# Patient Record
Sex: Male | Born: 1970 | Race: Black or African American | Hispanic: No | Marital: Single | Smoking: Never smoker
Health system: Southern US, Community
[De-identification: ages and names within clinical notes are randomized; demographics above are authoritative.]

## PROBLEM LIST (undated history)

## (undated) DIAGNOSIS — G4730 Sleep apnea, unspecified: Secondary | ICD-10-CM

## (undated) DIAGNOSIS — F419 Anxiety disorder, unspecified: Secondary | ICD-10-CM

## (undated) DIAGNOSIS — F329 Major depressive disorder, single episode, unspecified: Secondary | ICD-10-CM

## (undated) DIAGNOSIS — F32A Depression, unspecified: Secondary | ICD-10-CM

## (undated) DIAGNOSIS — R7303 Prediabetes: Secondary | ICD-10-CM

## (undated) HISTORY — DX: Anxiety disorder, unspecified: F41.9

## (undated) HISTORY — PX: BUNIONECTOMY: SHX129

## (undated) HISTORY — DX: Depression, unspecified: F32.A

## (undated) HISTORY — PX: KNEE ARTHROSCOPY: SUR90

## (undated) HISTORY — PX: TONSILLECTOMY: SUR1361

## (undated) HISTORY — PX: BACK SURGERY: SHX140

---
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
## 2007-04-23 ENCOUNTER — Emergency Department (HOSPITAL_COMMUNITY): Admission: EM | Admit: 2007-04-23 | Discharge: 2007-04-23 | Payer: Self-pay | Admitting: Emergency Medicine

## 2012-11-19 ENCOUNTER — Emergency Department (HOSPITAL_COMMUNITY)
Admission: EM | Admit: 2012-11-19 | Discharge: 2012-11-19 | Disposition: A | Discharge status: Home / Self Care | Payer: BC Managed Care – PPO | Source: Home / Self Care | Attending: Emergency Medicine | Admitting: Emergency Medicine

## 2012-11-19 ENCOUNTER — Encounter (HOSPITAL_COMMUNITY): Payer: Self-pay | Admitting: *Deleted

## 2012-11-19 DIAGNOSIS — M722 Plantar fascial fibromatosis: Secondary | ICD-10-CM

## 2012-11-19 MED ORDER — MELOXICAM 15 MG PO TABS: 15.0000 mg | ORAL_TABLET | Freq: Every day | ORAL | Status: DC

## 2012-11-19 MED ORDER — METHYLPREDNISOLONE ACETATE 40 MG/ML IJ SUSP
INTRAMUSCULAR | Status: AC
  Filled 2012-11-19: qty 5

## 2012-11-19 NOTE — ED Notes (Signed)
Pt  Reports     Pain     r  Foot          X   Several  Weeks            In the  Heel  Area     He  denys  Any  specefic  Injury         He  Is  Able   t ambulate on the  Affected foot  But has  Pain on  Doing  So

## 2012-11-19 NOTE — ED Provider Notes (Signed)
Chief Complaint:   Chief Complaint  Patient presents with  . Foot Pain    History of Present Illness:   Brent Crawford is a 42 year old male who has had a two-week history of plantar right heel pain. He denies any injury or swelling. He's on his feet extensively during the daytime. The pain is worse in the morning when he first gets up or if he is off his feet for a while. It also hurts if he has been on his feet for long time. The pain gets better if he stretches a little bit. He denies any numbness or tingling.  Review of Systems:  Other than noted above, the patient denies any of the following symptoms: Systemic:  No fevers, chills, or sweats.  No fatigue or tiredness. Musculoskeletal:  No joint pain, arthritis, bursitis, swelling, or back pain.  Neurological:  No muscular weakness, paresthesias.  PMFSH:  Past medical history, family history, social history, meds, and allergies were reviewed.  No history of gout.     Physical Exam:   Vital signs:  BP 142/86  Pulse 72  Temp(Src) 98.6 F (37 C) (Oral)  Resp 18  SpO2 100% Gen:  Alert and oriented times 3.  In no distress. Musculoskeletal:  Exam of the foot reveals there is pain to palpation over the insertion of the plantar fascia on the calcaneus. There was no visible swelling. There is pain at the plantar fascia stretched. He has no pain over the ankle, dorsum of the foot, instep, metatarsals, MTP joints, toes, and Achilles tendon, or lateral heel.  Otherwise, all joints had a full a ROM with no swelling, bruising or deformity.  No edema, pulses full. Extremities were warm and pink.  Capillary refill was brisk.  Skin:  Clear, warm and dry.  No rash. Neuro:  Alert and oriented times 3.  Muscle strength was normal.  Sensation was intact to light touch.   Procedure Note:  Verbal informed consent was obtained from the patient.  Risks and benefits were outlined with the patient.  Patient understands and accepts these risks.  Identity of the  patient was confirmed verbally and by armband.    Procedure was performed as follows:  The right plantar fascia was injected as follows. The medial aspect of the right foot was prepped with Betadine and alcohol, and insertion point was identified and marked, this was then anesthetized with ethyl chloride spray. He is a 1/2 inch 27-gauge needle, 1 cc of Depo-Medrol 40 mg strength and 1 cc of 2% Xylocaine were injected at the insertion of the plantar fascia. Patient tolerated procedure well. Band-Aid dressing was applied. He was instructed to rest this and apply ice for the next 24-48 hours and then begin an exercise program.  Patient tolerated the procedure well without any immediate complications.   Assessment:  The encounter diagnosis was Plantar fasciitis.  Plan:   1.  Meds:  The following meds were prescribed:   New Prescriptions   MELOXICAM (MOBIC) 15 MG TABLET    Take 1 tablet (15 mg total) by mouth daily.    2.  Patient Education/Counseling:  The patient was given appropriate handouts, self care instructions, and instructed in symptomatic relief including rest and activity, elevation, application of ice and compression.  He was instructed in proper footwear, use of a heel pad, use a night splint, and stretching exercises.  3.  Follow up:  The patient was told to follow up if no better in 3 to 4 days, if  becoming worse in any way, and given some red flag symptoms such as worsening pain which would prompt immediate return.  Follow up  With Dr. Cristie Hem if no better in 2 weeks.       Reuben Likes, MD 11/19/12 1031

## 2013-07-04 ENCOUNTER — Encounter: Payer: Self-pay | Admitting: Internal Medicine

## 2014-01-25 ENCOUNTER — Emergency Department (HOSPITAL_COMMUNITY)
Admission: EM | Admit: 2014-01-25 | Discharge: 2014-01-25 | Disposition: A | Discharge status: Home / Self Care | Payer: BC Managed Care – PPO | Attending: Emergency Medicine | Admitting: Emergency Medicine

## 2014-01-25 ENCOUNTER — Encounter (HOSPITAL_COMMUNITY): Payer: Self-pay | Admitting: *Deleted

## 2014-01-25 DIAGNOSIS — Z791 Long term (current) use of non-steroidal anti-inflammatories (NSAID): Secondary | ICD-10-CM | POA: Diagnosis not present

## 2014-01-25 DIAGNOSIS — M545 Low back pain, unspecified: Secondary | ICD-10-CM

## 2014-01-25 MED ORDER — HYDROCODONE-ACETAMINOPHEN 5-325 MG PO TABS: 2.0000 | ORAL_TABLET | ORAL | Status: DC | PRN

## 2014-01-25 MED ORDER — NAPROXEN 500 MG PO TABS: 500.0000 mg | ORAL_TABLET | Freq: Two times a day (BID) | ORAL | Status: DC

## 2014-01-25 MED ORDER — OXYCODONE-ACETAMINOPHEN 5-325 MG PO TABS
2.0000 | ORAL_TABLET | Freq: Once | ORAL | Status: AC
  Administered 2014-01-25: 2 via ORAL
  Filled 2014-01-25: qty 2

## 2014-01-25 NOTE — ED Notes (Signed)
Pt is here with left lower back pain since 1200 yesterday after bending over to pick something up and now increase pain to lower back if puts weight on left side

## 2014-01-25 NOTE — ED Provider Notes (Signed)
CSN: [id number]     Arrival date & time 01/25/14  [id number] History   First MD Initiated Contact with Patient 01/25/14 [phone]     Chief Complaint  Patient presents with  . Back Pain     (Consider location/radiation/quality/duration/timing/severity/associated sxs/prior Treatment) HPI Brent Crawford is a 43 y.o. male with no significant past medical history comes in for evaluation of low back pain. Patient states yesterday at noon he was bending over to pick up a very light box and "turned the wrong way" and immediately felt a sharp pain in his lower left back that radiates down his left leg. He reports trying one of his girlfriend's Flexeril tablets with minimal relief. He has not tried any other medications. Certain movements exacerbate the pain. Patient is ambulatory with discomfort. No other modifying factors He denies numbness, weakness, saddle paresthesias. No other pathological back pain red flags.   History reviewed. No pertinent past medical history. Past Surgical History  Procedure Laterality Date  . Knee arthroscopy    . Bunionectomy     No family history on file. History  Substance Use Topics  . Smoking status: Never Smoker   . Smokeless tobacco: Current User  . Alcohol Use: Yes    Review of Systems  Constitutional: Negative for fever.  HENT: Negative for sore throat.   Eyes: Negative for visual disturbance.  Respiratory: Negative for shortness of breath.   Cardiovascular: Negative for chest pain.  Gastrointestinal: Negative for abdominal pain.  Endocrine: Negative for polyuria.  Genitourinary: Negative for dysuria.  Musculoskeletal: Positive for back pain.  Skin: Negative for rash.  Neurological: Negative for headaches.      Allergies  Review of patient's allergies indicates no known allergies.  Home Medications   Prior to Admission medications   Medication Sig Start Date End Date Taking? Authorizing Provider  HYDROcodone-acetaminophen (NORCO/VICODIN) 5-325 MG per  tablet Take 2 tablets by mouth every 4 (four) hours as needed for moderate pain or severe pain. 01/25/14   Earle GellBenjamin W Ronniesha Seibold, PA-C  meloxicam (MOBIC) 15 MG tablet Take 1 tablet (15 mg total) by mouth daily. 11/19/12   Reuben Likesavid C Keller, MD  naproxen (NAPROSYN) 500 MG tablet Take 1 tablet (500 mg total) by mouth 2 (two) times daily. 01/25/14   Earle GellBenjamin W Dairon Procter, PA-C   BP 144/77 mmHg  Pulse 88  Temp(Src) 98.4 F (36.9 C) (Oral)  Resp 20  SpO2 97% Physical Exam  Constitutional:  Awake, alert, nontoxic appearance with baseline speech.  HENT:  Head: Atraumatic.  Eyes: Pupils are equal, round, and reactive to light. Right eye exhibits no discharge. Left eye exhibits no discharge.  Neck: Neck supple.  Cardiovascular: Normal rate and regular rhythm.   No murmur heard. Pulmonary/Chest: Effort normal and breath sounds normal. No respiratory distress. He has no wheezes. He has no rales. He exhibits no tenderness.  Abdominal: Soft. Bowel sounds are normal. He exhibits no mass. There is no tenderness. There is no rebound.  Musculoskeletal:       Thoracic back: He exhibits no tenderness.       Lumbar back: He exhibits no tenderness.  Tenderness to palpation in left paraspinal lumbar muscles and over SI joint. No obvious rashes, lesions or deformities appreciated  Bilateral lower extremities non tender without new rashes or color change, baseline ROM with intact DP / PT pulses, CR<2 secs all digits bilaterally, sensation baseline light touch bilaterally for pt, DTR's symmetric and intact bilaterally KJ / AJ, motor symmetric bilateral 5 / 5  hip flexion, quadriceps, hamstrings, EHL, foot dorsiflexion, foot plantarflexion, gait somewhat antalgic but without apparent new ataxia.  Neurological:  Mental status baseline for patient.  Upper extremity motor strength and sensation intact and symmetric bilaterally.  Skin: No rash noted.  Psychiatric: He has a normal mood and affect.  Nursing note and vitals  reviewed.   ED Course  Procedures (including critical care time) Labs Review Labs Reviewed - No data to display  Imaging Review No results found.   EKG Interpretation None     Meds given in ED:  Medications  oxyCODONE-acetaminophen (PERCOCET/ROXICET) 5-325 MG per tablet 2 tablet (not administered)    New Prescriptions   HYDROCODONE-ACETAMINOPHEN (NORCO/VICODIN) 5-325 MG PER TABLET    Take 2 tablets by mouth every 4 (four) hours as needed for moderate pain or severe pain.   NAPROXEN (NAPROSYN) 500 MG TABLET    Take 1 tablet (500 mg total) by mouth 2 (two) times daily.   Filed Vitals:   01/25/14 0542  BP: 144/77  Pulse: 88  Temp: 98.4 F (36.9 C)  TempSrc: Oral  Resp: 20  SpO2: 97%    MDM  Vitals stable - WNL -afebrile Pt resting comfortably in ED. pain treated in ED. Patient states he feels better PE--not concerning for other acute or emergent pathologies. No pathologic back pain red flags. Doubt cauda equina, conus medullaris syndrome, epidural abscess  Will DC with naproxen for pain and inflammation management with Vicodin for breakthrough pain Discussed f/u with PCP and return precautions, pt very amenable to plan. Patient stable, in good condition and is appropriate for discharge   Final diagnoses:  Left-sided low back pain without sciatica        Sharlene MottsBenjamin W Breeann Reposa, PA-C 01/25/14 [id number]  Arby BarretteMarcy Pfeiffer, MD 01/25/14 1650

## 2014-01-26 ENCOUNTER — Other Ambulatory Visit: Payer: Self-pay | Admitting: Chiropractic Medicine

## 2014-01-26 ENCOUNTER — Ambulatory Visit
Admission: RE | Admit: 2014-01-26 | Discharge: 2014-01-26 | Disposition: A | Discharge status: Home / Self Care | Payer: BC Managed Care – PPO | Source: Ambulatory Visit | Attending: Chiropractic Medicine | Admitting: Chiropractic Medicine

## 2014-01-26 DIAGNOSIS — M545 Low back pain: Secondary | ICD-10-CM

## 2014-02-02 ENCOUNTER — Other Ambulatory Visit: Payer: Self-pay | Admitting: Chiropractic Medicine

## 2014-02-02 DIAGNOSIS — M5116 Intervertebral disc disorders with radiculopathy, lumbar region: Secondary | ICD-10-CM

## 2014-02-11 ENCOUNTER — Ambulatory Visit
Admission: RE | Admit: 2014-02-11 | Discharge: 2014-02-11 | Disposition: A | Discharge status: Home / Self Care | Payer: BC Managed Care – PPO | Source: Ambulatory Visit | Attending: Chiropractic Medicine | Admitting: Chiropractic Medicine

## 2014-02-11 DIAGNOSIS — M5116 Intervertebral disc disorders with radiculopathy, lumbar region: Secondary | ICD-10-CM

## 2014-10-20 ENCOUNTER — Other Ambulatory Visit: Payer: Self-pay | Admitting: Neurosurgery

## 2014-10-20 DIAGNOSIS — M5416 Radiculopathy, lumbar region: Secondary | ICD-10-CM

## 2014-10-31 ENCOUNTER — Ambulatory Visit
Admission: RE | Admit: 2014-10-31 | Discharge: 2014-10-31 | Disposition: A | Discharge status: Home / Self Care | Payer: Self-pay | Source: Ambulatory Visit | Attending: Neurosurgery | Admitting: Neurosurgery

## 2014-10-31 ENCOUNTER — Other Ambulatory Visit: Payer: Self-pay | Admitting: Neurosurgery

## 2014-10-31 DIAGNOSIS — M5416 Radiculopathy, lumbar region: Secondary | ICD-10-CM

## 2015-08-06 ENCOUNTER — Other Ambulatory Visit: Payer: Self-pay | Admitting: Orthopedic Surgery

## 2015-08-06 DIAGNOSIS — M5416 Radiculopathy, lumbar region: Secondary | ICD-10-CM

## 2015-08-14 ENCOUNTER — Inpatient Hospital Stay: Admission: RE | Admit: 2015-08-14 | Payer: Self-pay | Source: Ambulatory Visit

## 2016-11-13 ENCOUNTER — Emergency Department (HOSPITAL_COMMUNITY): Payer: BLUE CROSS/BLUE SHIELD

## 2016-11-13 ENCOUNTER — Encounter (HOSPITAL_COMMUNITY): Payer: Self-pay | Admitting: *Deleted

## 2016-11-13 ENCOUNTER — Other Ambulatory Visit: Payer: Self-pay

## 2016-11-13 ENCOUNTER — Emergency Department (HOSPITAL_COMMUNITY)
Admission: EM | Admit: 2016-11-13 | Discharge: 2016-11-13 | Disposition: A | Discharge status: Home / Self Care | Payer: BLUE CROSS/BLUE SHIELD | Attending: Emergency Medicine | Admitting: Emergency Medicine

## 2016-11-13 DIAGNOSIS — R079 Chest pain, unspecified: Secondary | ICD-10-CM

## 2016-11-13 LAB — CBC
HCT: 37.4 % — ABNORMAL LOW (ref 39.0–52.0)
Hemoglobin: 11.5 g/dL — ABNORMAL LOW (ref 13.0–17.0)
MCH: 24.4 pg — ABNORMAL LOW (ref 26.0–34.0)
MCHC: 30.7 g/dL (ref 30.0–36.0)
MCV: 79.4 fL (ref 78.0–100.0)
PLATELETS: 189 10*3/uL (ref 150–400)
RBC: 4.71 MIL/uL (ref 4.22–5.81)
RDW: 14 % (ref 11.5–15.5)
WBC: 8.4 10*3/uL (ref 4.0–10.5)

## 2016-11-13 LAB — I-STAT TROPONIN, ED: TROPONIN I, POC: 0 ng/mL (ref 0.00–0.08)

## 2016-11-13 LAB — BASIC METABOLIC PANEL
Anion gap: 4 — ABNORMAL LOW (ref 5–15)
BUN: 9 mg/dL (ref 6–20)
CALCIUM: 8.8 mg/dL — AB (ref 8.9–10.3)
CO2: 28 mmol/L (ref 22–32)
CREATININE: 1.24 mg/dL (ref 0.61–1.24)
Chloride: 105 mmol/L (ref 101–111)
GFR calc Af Amer: >60 mL/min (ref >60)
GLUCOSE: 89 mg/dL (ref 65–99)
Potassium: 4.4 mmol/L (ref 3.5–5.1)
SODIUM: 137 mmol/L (ref 135–145)

## 2016-11-13 LAB — TROPONIN I: Troponin I: <0.03 ng/mL (ref <0.03)

## 2016-11-13 MED ORDER — KETOROLAC TROMETHAMINE 60 MG/2ML IM SOLN
60.0000 mg | Freq: Once | INTRAMUSCULAR | Status: AC
  Administered 2016-11-13: 60 mg via INTRAMUSCULAR
  Filled 2016-11-13: qty 2

## 2016-11-13 MED ORDER — GABAPENTIN 100 MG PO CAPS: 100.0000 mg | ORAL_CAPSULE | Freq: Three times a day (TID) | ORAL | 0 refills | Status: DC

## 2016-11-13 MED ORDER — IBUPROFEN 800 MG PO TABS: 800.0000 mg | ORAL_TABLET | Freq: Three times a day (TID) | ORAL | 0 refills | Status: DC

## 2016-11-13 NOTE — ED Triage Notes (Signed)
Pt is here with left sided chest pain and the soreness is 6/10.  Right foot tingly times one week.

## 2016-11-13 NOTE — ED Notes (Signed)
ED Provider at bedside. 

## 2016-11-13 NOTE — ED Provider Notes (Signed)
Emergency Department Provider Note   I have reviewed the triage vital signs and the nursing notes.   HISTORY  Chief Complaint Chest Pain   HPI Brent Crawford is a 46 y.o. male without a significant past medical history the presents to the emergency department with left-sided chest pain just below his ribs is worse with a deep breath but no other exacerbating factors. No trauma to the area no obvious heavy lifting but he does work at [employer] as a Curator. States that this started last night and anything for symptoms but did take his normal 2 oxycodone before bed. Says he had a couple episodes of pain and overnight and then persisted again this morning. No association was breath, wheezing, cough, fever, lightheadedness, syncope, diaphoresis or other associated symptoms. Has had some of this before with a pulled muscle. Patient also complains of right foot paresthesias. Patient states this feels similar to previous episodes of left foot paresthesias when he had a back surgery. No trauma. No urinary retention, bowel incontinence, saddle anesthesia or weakness in that leg. It comes and goes. No obvious exacerbating or relieving factors. Not related to position.  History reviewed. No pertinent past medical history.  There are no active problems to display for this patient.   Past Surgical History:  Procedure Laterality Date  . BACK SURGERY    . BUNIONECTOMY    . KNEE ARTHROSCOPY      Current Outpatient Rx  . Order #: [id number] Class: Print  . Order #: [id number] Class: Print  . Order #: [id number] Class: Print  . Order #: [id number] Class: Normal  . Order #: [id number] Class: Print    Allergies Patient has no known allergies.  No family history on file.  Social History Social History  Substance Use Topics  . Smoking status: Never Smoker  . Smokeless tobacco: Current User  . Alcohol use Yes    Review of Systems  All other systems negative except as documented in the HPI. All pertinent  positives and negatives as reviewed in the HPI. ____________________________________________   PHYSICAL EXAM:  VITAL SIGNS: ED Triage Vitals  Enc Vitals Group     BP 11/13/16 1535 (!) 141/89     Pulse Rate 11/13/16 1535 80     Resp 11/13/16 1535 16     Temp 11/13/16 1535 98.1 F (36.7 C)     Temp Source 11/13/16 1535 Oral     SpO2 11/13/16 1535 97 %     Weight 11/13/16 1535 260 lb (117.9 kg)     Height 11/13/16 1535  (1.753 m)     Head Circumference --      Peak Flow --      Pain Score 11/13/16 1539 6     Pain Loc --      Pain Edu? --      Excl. in GC? --     Constitutional: Alert and oriented. Well appearing and in no acute distress. Eyes: Conjunctivae are normal. PERRL. EOMI. Head: Atraumatic. Nose: No congestion/rhinnorhea. Mouth/Throat: Mucous membranes are moist.  Oropharynx non-erythematous. Neck: No stridor.  No meningeal signs.   Cardiovascular: Normal rate, regular rhythm. Good peripheral circulation. Grossly normal heart sounds.   Respiratory: Normal respiratory effort.  No retractions. Lungs CTAB. Gastrointestinal: Soft and nontender. No distention. No abdominal ttp or suprapubic fullness/tenderness.  Musculoskeletal: No lower extremity tenderness nor edema. No gross deformities of extremities. Neurologic:  Normal speech and language. No gross focal neurologic deficits are appreciated. Normal RLE strength, sensation and patellar reflexes. No  saddle anesthesia.  Skin:  Skin is warm, dry and intact. No rash noted.   ____________________________________________   LABS (all labs ordered are listed, but only abnormal results are displayed)  Labs Reviewed  BASIC METABOLIC PANEL - Abnormal; Notable for the following:       Result Value   Calcium 8.8 (*)    Anion gap 4 (*)    All other components within normal limits  CBC - Abnormal; Notable for the following:    Hemoglobin 11.5 (*)    HCT 37.4 (*)    MCH 24.4 (*)    All other components within normal  limits  TROPONIN I  I-STAT TROPONIN, ED   ____________________________________________  EKG   EKG Interpretation  Date/Time:  Monday November 13 2016 15:28:42 EDT Ventricular Rate:  78 PR Interval:  158 QRS Duration: 68 QT Interval:  344 QTC Calculation: 392 R Axis:   68 Text Interpretation:  Normal sinus rhythm Cannot rule out Anterior infarct , age undetermined Abnormal ECG Baseline wander No significant change since last tracing Confirmed by Marily Memos [id number]) on 11/13/2016 6:33:37 PM       EKG Interpretation  Date/Time:  Monday November 13 2016 19:20:06 EDT Ventricular Rate:  55 PR Interval:  158 QRS Duration: 131 QT Interval:  404 QTC Calculation: 387 R Axis:   25 Text Interpretation:  Sinus rhythm Nonspecific intraventricular conduction delay No change from earlier today or since 03/2007 Confirmed by Marily Memos [phone]) on 11/13/2016 7:54:15 PM        ____________________________________________  RADIOLOGY  Dg Chest 2 View  Result Date: 11/13/2016 CLINICAL DATA:  Left chest pain and soreness today. Initial encounter. EXAM: CHEST  2 VIEW COMPARISON:  None. FINDINGS: The lungs are clear. Heart size is normal. No pneumothorax or pleural fluid. No focal bony abnormality. IMPRESSION: No acute disease. Electronically Signed   By: Drusilla Kanner M.D.   On: 11/13/2016 15:58    ____________________________________________   PROCEDURES  Procedure(s) performed:   Procedures   ____________________________________________   INITIAL IMPRESSION / ASSESSMENT AND PLAN / ED COURSE  Pertinent labs & imaging results that were available during my care of the patient were reviewed by me and considered in my medical decision making (see chart for details).  Unlikely to be ischemic in nature. Patient likely with muscular cause for symptoms. Improved with Toradol. Also with right lower extremity paresthesias could be related to his back low suspicion for cauda equina  or any other emergent causes for her symptoms this time. Low risk for PE based on wells/PERC. No e/o pneumonia/pericarditis/myocardiits or other acute causes on workup today.   ____________________________________________  FINAL CLINICAL IMPRESSION(S) / ED DIAGNOSES  Final diagnoses:  Nonspecific chest pain     MEDICATIONS GIVEN DURING THIS VISIT:  Medications  ketorolac (TORADOL) injection 60 mg (60 mg Intramuscular Given 11/13/16 1912)     NEW OUTPATIENT MEDICATIONS STARTED DURING THIS VISIT:  Discharge Medication List as of 11/13/2016  8:51 PM    START taking these medications   Details  gabapentin (NEURONTIN) 100 MG capsule Take 1 capsule (100 mg total) by mouth 3 (three) times daily., Starting Mon 11/13/2016, Print    ibuprofen (ADVIL,MOTRIN) 800 MG tablet Take 1 tablet (800 mg total) by mouth 3 (three) times daily., Starting Mon 11/13/2016, Print        Note:  This document was prepared using Dragon voice recognition software and may include unintentional dictation errors.   Marily Memos, MD 11/13/16 2141

## 2017-09-07 ENCOUNTER — Encounter: Payer: BLUE CROSS/BLUE SHIELD | Admitting: Podiatry

## 2017-11-05 NOTE — Progress Notes (Signed)
This encounter was created in error - please disregard.

## 2019-04-14 ENCOUNTER — Other Ambulatory Visit: Payer: Self-pay

## 2019-04-14 ENCOUNTER — Ambulatory Visit: Payer: No Typology Code available for payment source | Attending: Neurosurgery | Admitting: Physical Therapy

## 2019-04-14 ENCOUNTER — Encounter: Payer: Self-pay | Admitting: Physical Therapy

## 2019-04-14 DIAGNOSIS — M5416 Radiculopathy, lumbar region: Secondary | ICD-10-CM | POA: Insufficient documentation

## 2019-04-14 NOTE — Therapy (Signed)
[facility] [address] [place], [place], [address] Phone: [phone]   Fax:  [phone]  Physical Therapy Evaluation  Patient Details  Name: Brent Crawford MRN: [id number] Date of Birth: 1970-07-27 Referring Provider (PT): Hoyt Koch, MD   Encounter Date: 04/14/2019  PT End of Session - 04/14/19 1102    Visit Number  1    Number of Visits  15    Date for PT Re-Evaluation  05/26/19    Authorization Type  [facility]-15 visits authorized exp 09/24/19    Authorization - Visit Number  1    Authorization - Number of Visits  15    PT Start Time  0851    PT Stop Time  0925    PT Time Calculation (min)  34 min    Activity Tolerance  Patient tolerated treatment well    Behavior During Therapy  [facility] for tasks assessed/performed       Past Medical History:  Diagnosis Date  . Anxiety   . Depression     Past Surgical History:  Procedure Laterality Date  . BACK SURGERY    . BUNIONECTOMY    . KNEE ARTHROSCOPY      There were no vitals filed for this visit.   Subjective Assessment - 04/14/19 1045    Subjective  Pt. is a 49 y/o male referred to PT for lumbar radiculopathy diagnosis with history of L4-5 microdiskectomy for left-sided radicular symptoms in 2016. He has developed new right-sided leg pain symptoms with insidious onset over the past 6 months. Pain is increased in particular with standing and walking and eases with trunk ROM into flexion. He had most recent MRI 8/20 whiched showed severe left L4-5 foraminal stenosis and grade I spondylolisthesis but no evidence of neural impingement on right.    Pertinent History  L4-5 microdiskectomy 2016    Limitations  Standing;Walking    Diagnostic tests  MRI    Patient Stated Goals  Get rid of back and leg pain    Currently in Pain?  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 PT Assessment - 04/14/19 0001      Assessment   Medical Diagnosis  Lumbar radiculopathy    Referring Provider (PT)  Hoyt Koch, MD    Onset Date/Surgical Date  --   chronic history with exacerbation x 6 months   Prior Therapy  past PT for back around 2016-2017      Precautions   Precautions  None      Restrictions   Weight Bearing Restrictions  No      Balance Screen   Has the patient fallen in the past 6 months  No      Home Environment   Living Environment  Private residence    Living Arrangements  Spouse/significant other;Children    Type of [place] Access  Level entry    [place]  One level      Prior Function   Level of Independence  Independent    Vocation  --   Engineer, 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     Cognition   Overall Cognitive Status  Within Functional Limits for tasks assessed      Observation/Other Assessments   Focus on Therapeutic Outcomes (FOTO)   unable to assess due to clinic internet out at time of eval      Sensation   Light Touch  Appears Intact      Posture/Postural Control   Posture Comments  Mild rounding of shoulders and lumbar spine in sitting      ROM / Strength   AROM / PROM / Strength  AROM;Strength      AROM   Overall AROM Comments  Increased back pain with extension and report of increased right leg pain with left sidebending ROM    AROM Assessment Site  Hip;Lumbar    Right/Left Hip  Right;Left    Right Hip Flexion  90    Right Hip External  Rotation   35    Right Hip Internal Rotation   15    Right Hip ABduction  45    Right Hip ADduction  --   [facility]   Left Hip Flexion  100    Left Hip External Rotation   30    Left Hip Internal Rotation   15    Left Hip ABduction  45    Left Hip ADduction  --   [facility]   Lumbar Flexion  80    Lumbar Extension  10    Lumbar - Right Side Bend  35    Lumbar - Left Side Bend  25    Lumbar - Right Rotation  30%    Lumbar - Left Rotation  50%      Strength   Strength Assessment Site  Hip;Knee;Ankle    Right/Left Hip  Right;Left    Right Hip Flexion  4-/5    Right Hip Extension  4/5    Right Hip External Rotation   4/5    Right Hip Internal Rotation  4/5    Right Hip ABduction  4/5    Right Hip ADduction  4/5    Left Hip Flexion  4/5    Left Hip Extension  4/5    Left Hip External Rotation  5/5    Left Hip Internal Rotation  5/5    Left Hip ABduction  4/5    Left Hip ADduction  4/5    Right/Left Knee  Right;Left    Right Knee Flexion  5/5    Right Knee Extension  5/5    Left Knee Flexion  5/5    Left Knee Extension  5/5    Right/Left Ankle  Right;Left    Right Ankle Dorsiflexion  5/5    Right Ankle Inversion  5/5    Right Ankle Eversion  5/5    Left Ankle Dorsiflexion  5/5    Left Ankle Inversion  5/5    Left Ankle Eversion  5/5      Flexibility   Soft Tissue Assessment /Muscle Length  --   hamstring tightness SLR 70 deg bilat., piriformis tightness      Palpation   Palpation comment  Sore with palpation right lateral lumbar paraspinal and QL region, no significant TTP right posterolateral hip      Special Tests   Other special tests  SLR (-) bilat.                Objective measurements completed on examination: See above findings.      [facility] Adult PT Treatment/Exercise - 04/14/19 0001      Exercises   Exercises  Lumbar      Lumbar Exercises: Supine   Other Supine Lumbar Exercises  HEP instruction pelvic tilt, SKTC vs. seated trunk flexion, LTR, standing  rigth QL stretch in doorway-unable to print handout due to internet down at time of eval             PT Education - 04/14/19 1101    Education Details  HEP, spinal anatomy, potential symptom etiology, POC    Person(s) Educated  Patient    Methods  Explanation;Demonstration;Tactile cues;Verbal cues    Comprehension  Returned demonstration;Verbalized understanding          PT Long Term Goals - 04/14/19 1112      PT LONG TERM GOAL #1   Title  Independent with HEP    Baseline  needs HEP    Time  6    Period  Weeks    Status  New    Target Date  05/26/19      PT LONG TERM GOAL #2   Title  Tolerate standing/walking periods at least 30-40 minutes for IADLs and community mobility for activities such as shopping with lumbar and LE pain decreased at least 50% from baseline    Baseline  3/10 pain this AM but higher pain with standing and walking, worse later in day    Time  6    Period  Weeks    Status  New    Target Date  05/26/19      PT LONG TERM GOAL #3   Title  Increase bilat. 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 presents with lumbar and right>left LE radicular symptoms with surgical history 2016 and MRI findings of severe left-sided neuroforaminal stenosis. He presents with flexion bias for trunk ROM which along with symptom exacaerbation with standing and walking would be consistent  with underlying stenosis. Additional findings of LE weakness and muscle tightness-for right-sided symptoms suspect potential muscular involvement with QL. Pt. would benefit from trial PT to attempt conservative management of symptoms.    Personal Factors and Comorbidities  Comorbidity 2;Time since  onset of injury/illness/exacerbation    Comorbidities  surgical history, anxiety/depression    Examination-Activity Limitations  Locomotion Level;Stand    Examination-Participation Restrictions  Community Activity    Stability/Clinical Decision Making  Evolving/Moderate complexity    Clinical Decision Making  Moderate    Rehab Potential  Fair    PT Frequency  2x / week    PT Duration  6 weeks    PT Treatment/Interventions  ADLs/Self Care Home Management;Cryotherapy;Traction;Ultrasound;Electrical Stimulation;Moist Heat;Therapeutic activities;Therapeutic exercise;Patient/family education;Manual techniques;Dry needling;Neuromuscular re-education    PT Next Visit Plan  check FOTO (unable at eval due to internet down) and review meds, review HEP and print copy, caution-no dry needling to left lumbar multifidi due to surgical history, continue/progress flexion bias trunk ROM and stabiization, stretch and possible trial dry needling to right QL, stretch hamstrings and piriformis    PT Home Exercise Plan  pelvic tilt, SKTC vs. seated trunk flexion, LTR, standing right QL stretch in doorway    Consulted and Agree with Plan of Care  Patient       Patient will benefit from skilled therapeutic intervention in order to improve the following deficits and impairments:  Difficulty walking, Decreased activity tolerance, Decreased strength, Impaired flexibility, Pain, Decreased range of motion  Visit Diagnosis: Radiculopathy, lumbar region     Problem List There are no problems to display for this patient.   Lazarus Gowda, PT, DPT 04/14/19 11:20 AM  [facility] [address] [place], [place], [address] Phone: [phone]   Fax:  [phone]  Name: Harvey Matlack MRN: [id number] Date of Birth: 1970-08-27

## 2019-11-21 ENCOUNTER — Other Ambulatory Visit: Payer: Self-pay | Admitting: Neurosurgery

## 2019-12-22 NOTE — Progress Notes (Signed)
[id number] [place], [place] - [address] AT [facility] [address] [place] [address] Phone: [phone] Fax: [phone]      Your procedure is scheduled on Thursday, October 28th.  Report to [facility] Main Entrance "A" at 5:30 A.M., and check in at the Admitting office.  Call this number if you have problems the morning of surgery:  [phone]  Call [phone] if you have any questions prior to your surgery date Monday-Friday 8am-4pm    Remember:  Do not eat or drink after midnight the night before your surgery   Take these medicines the morning of surgery with A SIP OF WATER   Oxycodone - if needed  As of today, STOP taking any Aspirin (unless otherwise instructed by your surgeon) Aleve, Naproxen, Ibuprofen, Motrin, Advil, Goody's, BC's, all herbal medications, fish oil, and all vitamins.                      Do not wear jewelry            Do not wear lotions, powders, colognes, or deodorant.            Men may shave face and neck.            Do not bring valuables to the hospital.            [facility] is not responsible for any belongings or valuables.  Do NOT Smoke (Tobacco/Vaping) or drink Alcohol 24 hours prior to your procedure If you use a CPAP at night, you may bring all equipment for your overnight stay.   Contacts, glasses, dentures or bridgework may not be worn into surgery.      For patients admitted to the hospital, discharge time will be determined by your treatment team.   Patients discharged the day of surgery will not be allowed to drive home, and someone needs to stay with them for 24 hours.    Special instructions:   [facility]- Preparing For Surgery  Before surgery, you can play an important role. Because skin is not sterile, your skin needs to be as free of germs as possible. You can reduce the number of germs on your skin by washing with CHG (chlorahexidine gluconate) Soap before  surgery.  CHG is an antiseptic cleaner which kills germs and bonds with the skin to continue killing germs even after washing.    Oral Hygiene is also important to reduce your risk of infection.  Remember - BRUSH YOUR TEETH THE MORNING OF SURGERY WITH YOUR REGULAR TOOTHPASTE  Please do not use if you have an allergy to CHG or antibacterial soaps. If your skin becomes reddened/irritated stop using the CHG.  Do not shave (including legs and underarms) for at least 48 hours prior to first CHG shower. It is OK to shave your face.  Please follow these instructions carefully.   1. Shower the NIGHT BEFORE SURGERY and the MORNING OF SURGERY with CHG Soap.   2. If you chose to wash your hair, wash your hair first as usual with your normal shampoo.  3. After you shampoo, rinse your hair and body thoroughly to remove the shampoo.  4. Use CHG as you would any other liquid soap. You can apply CHG directly to the skin and wash gently with a scrungie or a clean washcloth.   5. Apply the CHG Soap to your body ONLY FROM THE NECK DOWN.  Do not  use on open wounds or open sores. Avoid contact with your eyes, ears, mouth and genitals (private parts). Wash Face and genitals (private parts)  with your normal soap.   6. Wash thoroughly, paying special attention to the area where your surgery will be performed.  7. Thoroughly rinse your body with warm water from the neck down.  8. DO NOT shower/wash with your normal soap after using and rinsing off the CHG Soap.  9. Pat yourself dry with a CLEAN TOWEL.  10. Wear CLEAN PAJAMAS to bed the night before surgery  11. Place CLEAN SHEETS on your bed the night of your first shower and DO NOT SLEEP WITH PETS.   Day of Surgery: Wear Clean/Comfortable clothing the morning of surgery Do not apply any deodorants/lotions.   Remember to brush your teeth WITH YOUR REGULAR TOOTHPASTE.   Please read over the following fact sheets that you were given.

## 2019-12-23 ENCOUNTER — Inpatient Hospital Stay (HOSPITAL_COMMUNITY)
Admission: RE | Admit: 2019-12-23 | Discharge: 2019-12-23 | Disposition: A | Discharge status: Home / Self Care | Payer: Non-veteran care | Source: Ambulatory Visit

## 2019-12-23 ENCOUNTER — Other Ambulatory Visit (HOSPITAL_COMMUNITY)
Admission: RE | Admit: 2019-12-23 | Discharge: 2019-12-23 | Disposition: A | Discharge status: Home / Self Care | Payer: No Typology Code available for payment source | Source: Ambulatory Visit | Attending: Neurosurgery | Admitting: Neurosurgery

## 2019-12-23 DIAGNOSIS — Z01812 Encounter for preprocedural laboratory examination: Secondary | ICD-10-CM | POA: Insufficient documentation

## 2019-12-23 DIAGNOSIS — Z20822 Contact with and (suspected) exposure to covid-19: Secondary | ICD-10-CM | POA: Insufficient documentation

## 2019-12-23 LAB — SARS CORONAVIRUS 2 (TAT 6-24 HRS): SARS Coronavirus 2: NEGATIVE

## 2019-12-24 ENCOUNTER — Other Ambulatory Visit: Payer: Self-pay

## 2019-12-24 ENCOUNTER — Encounter (HOSPITAL_COMMUNITY): Payer: Self-pay

## 2019-12-24 ENCOUNTER — Encounter (HOSPITAL_COMMUNITY)
Admission: RE | Admit: 2019-12-24 | Discharge: 2019-12-24 | Disposition: A | Discharge status: Home / Self Care | Payer: No Typology Code available for payment source | Source: Ambulatory Visit | Attending: Neurosurgery | Admitting: Neurosurgery

## 2019-12-24 DIAGNOSIS — Z79891 Long term (current) use of opiate analgesic: Secondary | ICD-10-CM | POA: Insufficient documentation

## 2019-12-24 DIAGNOSIS — Z6841 Body Mass Index (BMI) 40.0 and over, adult: Secondary | ICD-10-CM | POA: Insufficient documentation

## 2019-12-24 DIAGNOSIS — Z01812 Encounter for preprocedural laboratory examination: Secondary | ICD-10-CM | POA: Insufficient documentation

## 2019-12-24 DIAGNOSIS — M4316 Spondylolisthesis, lumbar region: Secondary | ICD-10-CM | POA: Insufficient documentation

## 2019-12-24 DIAGNOSIS — R7303 Prediabetes: Secondary | ICD-10-CM | POA: Insufficient documentation

## 2019-12-24 DIAGNOSIS — G4733 Obstructive sleep apnea (adult) (pediatric): Secondary | ICD-10-CM | POA: Insufficient documentation

## 2019-12-24 HISTORY — DX: Prediabetes: R73.03

## 2019-12-24 HISTORY — DX: Sleep apnea, unspecified: G47.30

## 2019-12-24 LAB — CBC
HCT: 41.7 % (ref 39.0–52.0)
Hemoglobin: 12.4 g/dL — ABNORMAL LOW (ref 13.0–17.0)
MCH: 24.4 pg — ABNORMAL LOW (ref 26.0–34.0)
MCHC: 29.7 g/dL — ABNORMAL LOW (ref 30.0–36.0)
MCV: 82.1 fL (ref 80.0–100.0)
Platelets: 246 10*3/uL (ref 150–400)
RBC: 5.08 MIL/uL (ref 4.22–5.81)
RDW: 13.4 % (ref 11.5–15.5)
WBC: 6 10*3/uL (ref 4.0–10.5)
nRBC: 0 % (ref 0.0–0.2)

## 2019-12-24 LAB — GLUCOSE, CAPILLARY: Glucose-Capillary: 85 mg/dL (ref 70–99)

## 2019-12-24 LAB — TYPE AND SCREEN
ABO/RH(D): O POS
Antibody Screen: NEGATIVE

## 2019-12-24 LAB — SURGICAL PCR SCREEN
MRSA, PCR: NEGATIVE
Staphylococcus aureus: NEGATIVE

## 2019-12-24 MED ORDER — DEXTROSE 5 % IV SOLN
3.0000 g | INTRAVENOUS | Status: AC
  Administered 2019-12-25: 3 g via INTRAVENOUS
  Filled 2019-12-24: qty 3000
  Filled 2019-12-24: qty 3

## 2019-12-24 NOTE — Progress Notes (Signed)
Anesthesia Chart Review:  Case: [id number] Date/Time: 12/25/19 0715   Procedure: TLIF L45,RIGHT L5-S1 FORAMINOTOMY (Right ) - 3C   Anesthesia type: General   Pre-op diagnosis: SPONDYLOLISTHESIS AT LUMBAR 4- LUMBAR 5 LEVEL   Location: [facility] OR ROOM 20 / [place] OR   Surgeons: Vallarie Mare, MD      DISCUSSION: Patient is a 48 year old male scheduled for the above procedure.   History includes never smoker, OSA (does not use CPAP), pre-diabetes, anxiety, depression, back surgery (~ 2016). BMI is consistent with morbid obesity.   Reported pre-DM history. Random CBG 85. A1c done with PAT labs, but is in process. [facility] primary care records requested with 11/14/19 labs summarized below which included an A1c of 6.0% and unremarkable CMET.  He denied chest pain, SOB, cough, fever at PAT RN visit.   12/23/19 presurgical COVID-19 test negative. A1c result is still in process, but only 6.0% in June and CBG 85 at PAT. Anesthesia team to evaluate on the day of surgery.   VS: BP (!) 141/93   Pulse 70   Temp 36.8 C (Oral)   Resp 18   Ht 5' 9"  (1.753 m)   Wt 130.2 kg   SpO2 100%   BMI 42.38 kg/m   PROVIDERS: Annetta Maw, MD is PCP [facility])   LABS: Labs reviewed: Acceptable for surgery. Labs from the [facility] from 11/14/19 showed Na 136, K 4.0, BUN 9, Cr 1.010, glucose 90, ALT 21, AST 11, Alk Ph 72, albumin 3.4, total bili 0.2, A1c 6.0%. (all labs ordered are listed, but only abnormal results are displayed)  Labs Reviewed  CBC - Abnormal; Notable for the following components:      Result Value   Hemoglobin 12.4 (*)    MCH 24.4 (*)    MCHC 29.7 (*)    All other components within normal limits  SURGICAL PCR SCREEN  GLUCOSE, CAPILLARY  HEMOGLOBIN A1C  TYPE AND SCREEN    IMAGES: MRI L-spine 04/09/19 (report in [facility]/PACS): IMPRESSION: 1. At L4-5 there is a broad-based disc bulge eccentric towards the left with a left foraminal disc protrusion. Severe left foraminal stenosis. No  right foraminal stenosis. Prior left facetectomy. 2. At L5-S1 there is a broad-based disc bulge. Mild bilateral facet arthropathy. Mild left foraminal stenosis. Severe right foraminal stenosis.   EKG: N/A   CV: N/A  Past Medical History:  Diagnosis Date  . Anxiety   . Depression   . Pre-diabetes   . Sleep apnea    does not use CPAP    Past Surgical History:  Procedure Laterality Date  . BACK SURGERY    . BUNIONECTOMY    . KNEE ARTHROSCOPY    . TONSILLECTOMY      MEDICATIONS: . oxyCODONE (OXY IR/ROXICODONE) 5 MG immediate release tablet   No current facility-administered medications for this encounter.   Derrill Memo ON 12/25/2019] ceFAZolin (ANCEF) 3 g in dextrose 5 % 50 mL IVPB     Myra Gianotti, PA-C Surgical Short Stay/Anesthesiology [facility] Phone [phone] [facility] Phone [phone] 12/24/2019 4:21 PM

## 2019-12-24 NOTE — Anesthesia Preprocedure Evaluation (Addendum)
Anesthesia Evaluation  Patient identified by MRN, date of birth, ID band Patient awake    Reviewed: Allergy & Precautions, NPO status , Patient's Chart, lab work & pertinent test results  Airway Mallampati: III  TM Distance: >3 FB Neck ROM: Full    Dental no notable dental hx.    Pulmonary sleep apnea ,    Pulmonary exam normal breath sounds clear to auscultation       Cardiovascular negative cardio ROS Normal cardiovascular exam Rhythm:Regular Rate:Normal     Neuro/Psych PSYCHIATRIC DISORDERS Anxiety Depression negative neurological ROS     GI/Hepatic negative GI ROS, (+)     substance abuse  ,   Endo/Other  Morbid obesity  Renal/GU negative Renal ROS     Musculoskeletal negative musculoskeletal ROS (+) narcotic dependent  Abdominal (+) + obese,   Peds  Hematology negative hematology ROS (+)   Anesthesia Other Findings SPONDYLOLISTHESIS AT LUMBAR 4- LUMBAR 5 LEVE  Reproductive/Obstetrics                           Anesthesia Physical Anesthesia Plan  ASA: III  Anesthesia Plan: General   Post-op Pain Management:    Induction: Intravenous  PONV Risk Score and Plan: 3 and Ondansetron, Dexamethasone, Midazolam and Treatment may vary due to age or medical condition  Airway Management Planned: Oral ETT  Additional Equipment:   Intra-op Plan:   Post-operative Plan: Extubation in OR  Informed Consent: I have reviewed the patients History and Physical, chart, labs and discussed the procedure including the risks, benefits and alternatives for the proposed anesthesia with the patient or authorized representative who has indicated his/her understanding and acceptance.     Dental advisory given  Plan Discussed with: CRNA  Anesthesia Plan Comments: (Reviewed PAT note written 12/24/2019 by Shonna Chock, PA-C. )      Anesthesia Quick Evaluation

## 2019-12-24 NOTE — Progress Notes (Addendum)
PCP - Dolan Amen, MD w/ [facility]. Office records requested. Cardiologist - Denies  PPM/ICD - Denies  Chest x-ray - N/A EKG - N/A Stress Test - Denies ECHO - Denies Cardiac Cath - Denies  Sleep Study - Yes CPAP - No; per patient, he no longer wears it because he no longer snores or has fatigue after waking up.  Patient states he is pre-diabetic, does not check CBGs. A1C obtained.  Blood Thinner Instructions: N/A Aspirin Instructions: N/A  ERAS Protcol - N/A PRE-SURGERY Ensure or G2- N/A  COVID TEST- 12/23/19, Negative   Anesthesia review: Yes, PCP records requested.  Patient denies shortness of breath, fever, cough and chest pain at PAT appointment   All instructions explained to the patient, with a verbal understanding of the material. Patient agrees to go over the instructions while at home for a better understanding. Patient also instructed to self quarantine after being tested for COVID-19. The opportunity to ask questions was provided.

## 2019-12-25 ENCOUNTER — Inpatient Hospital Stay (HOSPITAL_COMMUNITY): Payer: No Typology Code available for payment source | Admitting: Certified Registered Nurse Anesthetist

## 2019-12-25 ENCOUNTER — Encounter (HOSPITAL_COMMUNITY): Payer: Self-pay

## 2019-12-25 ENCOUNTER — Inpatient Hospital Stay (HOSPITAL_COMMUNITY)
Admission: RE | Admit: 2019-12-25 | Discharge: 2019-12-27 | DRG: 454 | Disposition: A | Discharge status: Home / Self Care | Payer: No Typology Code available for payment source | Attending: Neurosurgery | Admitting: Neurosurgery

## 2019-12-25 ENCOUNTER — Inpatient Hospital Stay (HOSPITAL_COMMUNITY): Payer: No Typology Code available for payment source

## 2019-12-25 ENCOUNTER — Encounter (HOSPITAL_COMMUNITY)
Admission: RE | Disposition: A | Discharge status: Home / Self Care | Payer: Self-pay | Source: Home / Self Care | Attending: Neurosurgery

## 2019-12-25 DIAGNOSIS — M4316 Spondylolisthesis, lumbar region: Secondary | ICD-10-CM | POA: Diagnosis present

## 2019-12-25 DIAGNOSIS — Z6841 Body Mass Index (BMI) 40.0 and over, adult: Secondary | ICD-10-CM | POA: Diagnosis not present

## 2019-12-25 DIAGNOSIS — Z20822 Contact with and (suspected) exposure to covid-19: Secondary | ICD-10-CM | POA: Diagnosis present

## 2019-12-25 DIAGNOSIS — F17220 Nicotine dependence, chewing tobacco, uncomplicated: Secondary | ICD-10-CM | POA: Diagnosis present

## 2019-12-25 DIAGNOSIS — M48061 Spinal stenosis, lumbar region without neurogenic claudication: Principal | ICD-10-CM | POA: Diagnosis present

## 2019-12-25 DIAGNOSIS — G4733 Obstructive sleep apnea (adult) (pediatric): Secondary | ICD-10-CM | POA: Diagnosis present

## 2019-12-25 DIAGNOSIS — M5126 Other intervertebral disc displacement, lumbar region: Secondary | ICD-10-CM | POA: Diagnosis present

## 2019-12-25 DIAGNOSIS — L905 Scar conditions and fibrosis of skin: Secondary | ICD-10-CM | POA: Diagnosis present

## 2019-12-25 DIAGNOSIS — M4807 Spinal stenosis, lumbosacral region: Secondary | ICD-10-CM | POA: Diagnosis present

## 2019-12-25 DIAGNOSIS — Z419 Encounter for procedure for purposes other than remedying health state, unspecified: Secondary | ICD-10-CM

## 2019-12-25 HISTORY — PX: TRANSFORAMINAL LUMBAR INTERBODY FUSION (TLIF) WITH PEDICLE SCREW FIXATION 1 LEVEL: SHX6141

## 2019-12-25 LAB — HEMOGLOBIN A1C
Hgb A1c MFr Bld: 5.5 % (ref 4.8–5.6)
Mean Plasma Glucose: 111 mg/dL

## 2019-12-25 LAB — ABO/RH: ABO/RH(D): O POS

## 2019-12-25 LAB — GLUCOSE, CAPILLARY: Glucose-Capillary: 106 mg/dL — ABNORMAL HIGH (ref 70–99)

## 2019-12-25 SURGERY — TRANSFORAMINAL LUMBAR INTERBODY FUSION (TLIF) WITH PEDICLE SCREW FIXATION 1 LEVEL
Anesthesia: General | Laterality: Right

## 2019-12-25 MED ORDER — CHLORHEXIDINE GLUCONATE 0.12 % MT SOLN: 15.0000 mL | Freq: Once | OROMUCOSAL | Status: AC

## 2019-12-25 MED ORDER — SODIUM CHLORIDE (PF) 0.9 % IJ SOLN
INTRAMUSCULAR | Status: DC | PRN
  Administered 2019-12-25: 10 mL via INTRAVENOUS

## 2019-12-25 MED ORDER — OXYCODONE HCL 5 MG/5ML PO SOLN: 5.0000 mg | Freq: Once | ORAL | Status: AC | PRN

## 2019-12-25 MED ORDER — LIDOCAINE-EPINEPHRINE 1 %-1:100000 IJ SOLN
INTRAMUSCULAR | Status: AC
  Filled 2019-12-25: qty 1

## 2019-12-25 MED ORDER — LACTATED RINGERS IV SOLN: INTRAVENOUS | Status: DC

## 2019-12-25 MED ORDER — OXYCODONE-ACETAMINOPHEN 5-325 MG PO TABS
1.0000 | ORAL_TABLET | ORAL | Status: DC | PRN
  Administered 2019-12-25 – 2019-12-27 (×9): 2 via ORAL
  Filled 2019-12-25 (×8): qty 2

## 2019-12-25 MED ORDER — ONDANSETRON HCL 4 MG/2ML IJ SOLN
INTRAMUSCULAR | Status: DC | PRN
  Administered 2019-12-25 (×2): 4 mg via INTRAVENOUS

## 2019-12-25 MED ORDER — ACETAMINOPHEN 325 MG PO TABS: 650.0000 mg | ORAL_TABLET | ORAL | Status: DC | PRN

## 2019-12-25 MED ORDER — POLYETHYLENE GLYCOL 3350 17 G PO PACK: 17.0000 g | PACK | Freq: Every day | ORAL | Status: DC | PRN

## 2019-12-25 MED ORDER — BUPIVACAINE HCL (PF) 0.5 % IJ SOLN
INTRAMUSCULAR | Status: AC
  Filled 2019-12-25: qty 30

## 2019-12-25 MED ORDER — CHLORHEXIDINE GLUCONATE CLOTH 2 % EX PADS: 6.0000 | MEDICATED_PAD | Freq: Once | CUTANEOUS | Status: DC

## 2019-12-25 MED ORDER — SUGAMMADEX SODIUM 200 MG/2ML IV SOLN
INTRAVENOUS | Status: DC | PRN
  Administered 2019-12-25: 300 mg via INTRAVENOUS

## 2019-12-25 MED ORDER — HYDROMORPHONE HCL 1 MG/ML IJ SOLN
0.2500 mg | INTRAMUSCULAR | Status: DC | PRN
  Administered 2019-12-25 (×2): 0.5 mg via INTRAVENOUS

## 2019-12-25 MED ORDER — ROCURONIUM BROMIDE 10 MG/ML (PF) SYRINGE
PREFILLED_SYRINGE | INTRAVENOUS | Status: DC | PRN
  Administered 2019-12-25 (×3): 20 mg via INTRAVENOUS
  Administered 2019-12-25: 100 mg via INTRAVENOUS

## 2019-12-25 MED ORDER — POTASSIUM CHLORIDE IN NACL 20-0.9 MEQ/L-% IV SOLN
INTRAVENOUS | Status: DC
  Filled 2019-12-25: qty 1000

## 2019-12-25 MED ORDER — PANTOPRAZOLE SODIUM 40 MG PO TBEC
40.0000 mg | DELAYED_RELEASE_TABLET | Freq: Every day | ORAL | Status: DC
  Administered 2019-12-26 – 2019-12-27 (×2): 40 mg via ORAL
  Filled 2019-12-25 (×2): qty 1

## 2019-12-25 MED ORDER — LIDOCAINE-EPINEPHRINE 1 %-1:100000 IJ SOLN
INTRAMUSCULAR | Status: DC | PRN
  Administered 2019-12-25: 10 mL

## 2019-12-25 MED ORDER — FENTANYL CITRATE (PF) 250 MCG/5ML IJ SOLN
INTRAMUSCULAR | Status: AC
  Filled 2019-12-25: qty 5

## 2019-12-25 MED ORDER — 0.9 % SODIUM CHLORIDE (POUR BTL) OPTIME
TOPICAL | Status: DC | PRN
  Administered 2019-12-25: 1000 mL

## 2019-12-25 MED ORDER — FENTANYL CITRATE (PF) 100 MCG/2ML IJ SOLN
INTRAMUSCULAR | Status: DC | PRN
  Administered 2019-12-25 (×4): 50 ug via INTRAVENOUS
  Administered 2019-12-25: 100 ug via INTRAVENOUS
  Administered 2019-12-25 (×4): 50 ug via INTRAVENOUS

## 2019-12-25 MED ORDER — OXYCODONE-ACETAMINOPHEN 5-325 MG PO TABS
1.0000 | ORAL_TABLET | Freq: Four times a day (QID) | ORAL | Status: DC | PRN
  Administered 2019-12-25: 2 via ORAL
  Filled 2019-12-25 (×2): qty 2

## 2019-12-25 MED ORDER — HYDROMORPHONE HCL 1 MG/ML IJ SOLN
INTRAMUSCULAR | Status: AC
  Filled 2019-12-25: qty 0.5

## 2019-12-25 MED ORDER — CHLORHEXIDINE GLUCONATE 0.12 % MT SOLN
OROMUCOSAL | Status: AC
  Administered 2019-12-25: 15 mL via OROMUCOSAL
  Filled 2019-12-25: qty 15

## 2019-12-25 MED ORDER — CYCLOBENZAPRINE HCL 10 MG PO TABS
10.0000 mg | ORAL_TABLET | Freq: Three times a day (TID) | ORAL | Status: DC | PRN
  Administered 2019-12-25 – 2019-12-26 (×3): 10 mg via ORAL
  Filled 2019-12-25 (×2): qty 1

## 2019-12-25 MED ORDER — ACETAMINOPHEN 650 MG RE SUPP: 650.0000 mg | RECTAL | Status: DC | PRN

## 2019-12-25 MED ORDER — DEXMEDETOMIDINE (PRECEDEX) IN NS 20 MCG/5ML (4 MCG/ML) IV SYRINGE
PREFILLED_SYRINGE | INTRAVENOUS | Status: AC
  Filled 2019-12-25: qty 10

## 2019-12-25 MED ORDER — DEXAMETHASONE SODIUM PHOSPHATE 10 MG/ML IJ SOLN
INTRAMUSCULAR | Status: AC
  Filled 2019-12-25: qty 1

## 2019-12-25 MED ORDER — SODIUM CHLORIDE 0.9% FLUSH
3.0000 mL | Freq: Two times a day (BID) | INTRAVENOUS | Status: DC
  Administered 2019-12-25 – 2019-12-26 (×3): 3 mL via INTRAVENOUS

## 2019-12-25 MED ORDER — THROMBIN 5000 UNITS EX SOLR
OROMUCOSAL | Status: DC | PRN
  Administered 2019-12-25: 5 mL via TOPICAL

## 2019-12-25 MED ORDER — ONDANSETRON HCL 4 MG/2ML IJ SOLN
INTRAMUSCULAR | Status: AC
  Filled 2019-12-25: qty 2

## 2019-12-25 MED ORDER — BUPIVACAINE LIPOSOME 1.3 % IJ SUSP
20.0000 mL | Freq: Once | INTRAMUSCULAR | Status: AC
  Administered 2019-12-25: 20 mL
  Filled 2019-12-25: qty 20

## 2019-12-25 MED ORDER — THROMBIN 5000 UNITS EX SOLR
CUTANEOUS | Status: AC
  Filled 2019-12-25: qty 5000

## 2019-12-25 MED ORDER — FLEET ENEMA 7-19 GM/118ML RE ENEM: 1.0000 | ENEMA | Freq: Once | RECTAL | Status: DC | PRN

## 2019-12-25 MED ORDER — ONDANSETRON HCL 4 MG/2ML IJ SOLN: 4.0000 mg | Freq: Four times a day (QID) | INTRAMUSCULAR | Status: DC | PRN

## 2019-12-25 MED ORDER — ACETAMINOPHEN 500 MG PO TABS
ORAL_TABLET | ORAL | Status: AC
  Administered 2019-12-25: 1000 mg via ORAL
  Filled 2019-12-25: qty 2

## 2019-12-25 MED ORDER — MIDAZOLAM HCL 2 MG/2ML IJ SOLN
INTRAMUSCULAR | Status: AC
  Filled 2019-12-25: qty 2

## 2019-12-25 MED ORDER — ALBUMIN HUMAN 5 % IV SOLN: INTRAVENOUS | Status: DC | PRN

## 2019-12-25 MED ORDER — OXYCODONE HCL 5 MG PO TABS
5.0000 mg | ORAL_TABLET | Freq: Once | ORAL | Status: AC | PRN
  Administered 2019-12-25: 5 mg via ORAL

## 2019-12-25 MED ORDER — ACETAMINOPHEN 500 MG PO TABS: 1000.0000 mg | ORAL_TABLET | Freq: Once | ORAL | Status: AC

## 2019-12-25 MED ORDER — PROPOFOL 10 MG/ML IV BOLUS
INTRAVENOUS | Status: DC | PRN
  Administered 2019-12-25: 150 mg via INTRAVENOUS
  Administered 2019-12-25: 50 mg via INTRAVENOUS

## 2019-12-25 MED ORDER — HYDROMORPHONE HCL 1 MG/ML IJ SOLN
INTRAMUSCULAR | Status: DC | PRN
  Administered 2019-12-25 (×2): .5 mg via INTRAVENOUS

## 2019-12-25 MED ORDER — DEXAMETHASONE SODIUM PHOSPHATE 10 MG/ML IJ SOLN
INTRAMUSCULAR | Status: DC | PRN
  Administered 2019-12-25: 10 mg via INTRAVENOUS

## 2019-12-25 MED ORDER — ORAL CARE MOUTH RINSE: 15.0000 mL | Freq: Once | OROMUCOSAL | Status: AC

## 2019-12-25 MED ORDER — ROCURONIUM BROMIDE 10 MG/ML (PF) SYRINGE
PREFILLED_SYRINGE | INTRAVENOUS | Status: AC
  Filled 2019-12-25: qty 10

## 2019-12-25 MED ORDER — MENTHOL 3 MG MT LOZG: 1.0000 | LOZENGE | OROMUCOSAL | Status: DC | PRN

## 2019-12-25 MED ORDER — PHENOL 1.4 % MT LIQD: 1.0000 | OROMUCOSAL | Status: DC | PRN

## 2019-12-25 MED ORDER — CEFAZOLIN SODIUM-DEXTROSE 2-4 GM/100ML-% IV SOLN
2.0000 g | Freq: Three times a day (TID) | INTRAVENOUS | Status: AC
  Administered 2019-12-25 – 2019-12-26 (×3): 2 g via INTRAVENOUS
  Filled 2019-12-25 (×3): qty 100

## 2019-12-25 MED ORDER — DOCUSATE SODIUM 100 MG PO CAPS
100.0000 mg | ORAL_CAPSULE | Freq: Two times a day (BID) | ORAL | Status: DC
  Administered 2019-12-25 – 2019-12-27 (×4): 100 mg via ORAL
  Filled 2019-12-25 (×4): qty 1

## 2019-12-25 MED ORDER — ENOXAPARIN SODIUM 40 MG/0.4ML ~~LOC~~ SOLN
40.0000 mg | SUBCUTANEOUS | Status: DC
  Administered 2019-12-26 – 2019-12-27 (×2): 40 mg via SUBCUTANEOUS
  Filled 2019-12-25 (×2): qty 0.4

## 2019-12-25 MED ORDER — HYDROMORPHONE HCL 1 MG/ML IJ SOLN
INTRAMUSCULAR | Status: AC
  Filled 2019-12-25: qty 1

## 2019-12-25 MED ORDER — LACTATED RINGERS IV SOLN: INTRAVENOUS | Status: DC | PRN

## 2019-12-25 MED ORDER — BUPIVACAINE HCL (PF) 0.5 % IJ SOLN
INTRAMUSCULAR | Status: DC | PRN
  Administered 2019-12-25: 30 mL

## 2019-12-25 MED ORDER — PROMETHAZINE HCL 25 MG/ML IJ SOLN: 6.2500 mg | INTRAMUSCULAR | Status: DC | PRN

## 2019-12-25 MED ORDER — CEFAZOLIN SODIUM-DEXTROSE 2-4 GM/100ML-% IV SOLN
2.0000 g | Freq: Three times a day (TID) | INTRAVENOUS | Status: DC
  Administered 2019-12-25: 2 g via INTRAVENOUS
  Filled 2019-12-25: qty 100

## 2019-12-25 MED ORDER — SODIUM CHLORIDE 0.9% FLUSH: 3.0000 mL | INTRAVENOUS | Status: DC | PRN

## 2019-12-25 MED ORDER — MIDAZOLAM HCL 2 MG/2ML IJ SOLN
INTRAMUSCULAR | Status: DC | PRN
  Administered 2019-12-25: 2 mg via INTRAVENOUS

## 2019-12-25 MED ORDER — DEXMEDETOMIDINE (PRECEDEX) IN NS 20 MCG/5ML (4 MCG/ML) IV SYRINGE
PREFILLED_SYRINGE | INTRAVENOUS | Status: DC | PRN
  Administered 2019-12-25 (×5): 8 ug via INTRAVENOUS

## 2019-12-25 MED ORDER — MORPHINE SULFATE (PF) 4 MG/ML IV SOLN
4.0000 mg | INTRAVENOUS | Status: DC | PRN
  Administered 2019-12-26: 4 mg via INTRAVENOUS
  Filled 2019-12-25: qty 1

## 2019-12-25 MED ORDER — ONDANSETRON HCL 4 MG PO TABS: 4.0000 mg | ORAL_TABLET | Freq: Four times a day (QID) | ORAL | Status: DC | PRN

## 2019-12-25 MED ORDER — MORPHINE SULFATE (PF) 2 MG/ML IV SOLN
2.0000 mg | INTRAVENOUS | Status: DC | PRN
  Administered 2019-12-25: 2 mg via INTRAVENOUS
  Filled 2019-12-25: qty 1

## 2019-12-25 MED ORDER — SODIUM CHLORIDE 0.9 % IV SOLN: 250.0000 mL | INTRAVENOUS | Status: DC

## 2019-12-25 MED ORDER — OXYCODONE HCL 5 MG PO TABS
ORAL_TABLET | ORAL | Status: AC
Start: 2019-12-25 — End: 2019-12-26
  Filled 2019-12-25: qty 1

## 2019-12-25 MED ORDER — LIDOCAINE 2% (20 MG/ML) 5 ML SYRINGE
INTRAMUSCULAR | Status: DC | PRN
  Administered 2019-12-25: 60 mg via INTRAVENOUS

## 2019-12-25 MED ORDER — LIDOCAINE 2% (20 MG/ML) 5 ML SYRINGE
INTRAMUSCULAR | Status: AC
  Filled 2019-12-25: qty 5

## 2019-12-25 MED ORDER — CYCLOBENZAPRINE HCL 10 MG PO TABS
ORAL_TABLET | ORAL | Status: AC
  Filled 2019-12-25: qty 1

## 2019-12-25 SURGICAL SUPPLY — 71 items
BAND RUBBER #18 3X1/16 STRL (MISCELLANEOUS) IMPLANT
BASKET BONE COLLECTION (BASKET) ×2 IMPLANT
BLADE CLIPPER SURG (BLADE) ×2 IMPLANT
BLADE SURG 11 STRL SS (BLADE) ×2 IMPLANT
BUR MATCHSTICK NEURO 3.0 LAGG (BURR) ×2 IMPLANT
BUR PRECISION FLUTE 5.0 (BURR) ×2 IMPLANT
BUR ROUND FLUTED 5 RND (BURR) ×2 IMPLANT
CAGE PROLIFT 10X28 SM 12D (Cage) ×2 IMPLANT
CANISTER SUCT 3000ML PPV (MISCELLANEOUS) ×2 IMPLANT
CNTNR URN SCR LID CUP LEK RST (MISCELLANEOUS) ×1 IMPLANT
CONT SPEC 4OZ STRL OR WHT (MISCELLANEOUS) ×1
COVER BACK TABLE 60X90IN (DRAPES) ×2 IMPLANT
COVER WAND RF STERILE (DRAPES) ×2 IMPLANT
DECANTER SPIKE VIAL GLASS SM (MISCELLANEOUS) ×2 IMPLANT
DERMABOND ADVANCED (GAUZE/BANDAGES/DRESSINGS) ×1
DERMABOND ADVANCED .7 DNX12 (GAUZE/BANDAGES/DRESSINGS) ×1 IMPLANT
DRAIN JACKSON PRATT 10MM FLAT (MISCELLANEOUS) ×2 IMPLANT
DRAPE 3/4 80X56 (DRAPES) ×2 IMPLANT
DRAPE C-ARM 42X72 X-RAY (DRAPES) ×2 IMPLANT
DRAPE C-ARMOR (DRAPES) ×2 IMPLANT
DRAPE LAPAROTOMY 100X72X124 (DRAPES) ×2 IMPLANT
DRAPE MICROSCOPE LEICA (MISCELLANEOUS) IMPLANT
DRSG OPSITE POSTOP 4X6 (GAUZE/BANDAGES/DRESSINGS) ×2 IMPLANT
DURAPREP 26ML APPLICATOR (WOUND CARE) ×2 IMPLANT
ELECT COATED BLADE 2.86 ST (ELECTRODE) ×2 IMPLANT
ELECT REM PT RETURN 9FT ADLT (ELECTROSURGICAL) ×2
ELECTRODE REM PT RTRN 9FT ADLT (ELECTROSURGICAL) ×1 IMPLANT
EVACUATOR SILICONE 100CC (DRAIN) ×2 IMPLANT
GAUZE 4X4 16PLY RFD (DISPOSABLE) IMPLANT
GAUZE SPONGE 4X4 12PLY STRL (GAUZE/BANDAGES/DRESSINGS) ×2 IMPLANT
GLOVE BIO SURGEON STRL SZ 6.5 (GLOVE) ×4 IMPLANT
GLOVE BIOGEL PI IND STRL 7.5 (GLOVE) ×3 IMPLANT
GLOVE BIOGEL PI INDICATOR 7.5 (GLOVE) ×3
GLOVE ECLIPSE 7.5 STRL STRAW (GLOVE) ×4 IMPLANT
GLOVE EXAM NITRILE XL STR (GLOVE) IMPLANT
GLOVE SURG SS PI 6.5 STRL IVOR (GLOVE) ×2 IMPLANT
GLOVE SURG SS PI 7.0 STRL IVOR (GLOVE) ×2 IMPLANT
GOWN STRL REUS W/ TWL LRG LVL3 (GOWN DISPOSABLE) ×4 IMPLANT
GOWN STRL REUS W/ TWL XL LVL3 (GOWN DISPOSABLE) ×3 IMPLANT
GOWN STRL REUS W/TWL 2XL LVL3 (GOWN DISPOSABLE) IMPLANT
GOWN STRL REUS W/TWL LRG LVL3 (GOWN DISPOSABLE) ×4
GOWN STRL REUS W/TWL XL LVL3 (GOWN DISPOSABLE) ×3
HEMOSTAT POWDER KIT SURGIFOAM (HEMOSTASIS) ×2 IMPLANT
KIT BASIN OR (CUSTOM PROCEDURE TRAY) ×2 IMPLANT
KIT TURNOVER KIT B (KITS) ×2 IMPLANT
MILL MEDIUM DISP (BLADE) IMPLANT
NEEDLE HYPO 18GX1.5 BLUNT FILL (NEEDLE) IMPLANT
NEEDLE HYPO 21X1.5 SAFETY (NEEDLE) ×2 IMPLANT
NEEDLE HYPO 22GX1.5 SAFETY (NEEDLE) ×2 IMPLANT
NEEDLE SPNL 18GX3.5 QUINCKE PK (NEEDLE) ×2 IMPLANT
NS IRRIG 1000ML POUR BTL (IV SOLUTION) ×2 IMPLANT
PACK LAMINECTOMY NEURO (CUSTOM PROCEDURE TRAY) ×2 IMPLANT
PAD ARMBOARD 7.5X6 YLW CONV (MISCELLANEOUS) ×4 IMPLANT
PUTTY DBF 6CC CORTICAL FIBERS (Putty) ×2 IMPLANT
ROD CURVED 40MM (Rod) ×2 IMPLANT
ROD PED STRT 5.5X35 (Rod) ×2 IMPLANT
SCREW POLYAXIAL 7.5X45 (Screw) ×8 IMPLANT
SET SCREW (Screw) ×4 IMPLANT
SET SCREW VRST (Screw) ×4 IMPLANT
SPONGE LAP 4X18 RFD (DISPOSABLE) IMPLANT
SPONGE SURGIFOAM ABS GEL 100 (HEMOSTASIS) IMPLANT
SUT MNCRL AB 3-0 PS2 18 (SUTURE) ×2 IMPLANT
SUT MNCRL AB 4-0 PS2 18 (SUTURE) ×2 IMPLANT
SUT VIC AB 0 CT1 18XCR BRD8 (SUTURE) ×2 IMPLANT
SUT VIC AB 0 CT1 8-18 (SUTURE) ×2
SUT VIC AB 2-0 CP2 18 (SUTURE) ×4 IMPLANT
SYR 30ML LL (SYRINGE) ×2 IMPLANT
TOWEL GREEN STERILE (TOWEL DISPOSABLE) ×2 IMPLANT
TOWEL GREEN STERILE FF (TOWEL DISPOSABLE) ×2 IMPLANT
TRAY FOLEY MTR SLVR 16FR STAT (SET/KITS/TRAYS/PACK) ×2 IMPLANT
WATER STERILE IRR 1000ML POUR (IV SOLUTION) ×2 IMPLANT

## 2019-12-25 NOTE — H&P (Signed)
CC: back pain and leg pain  HPI:     Patient is a 49 y.o. male with hx of of R L4-5 discectomy presented to clinic with severe back pain and left leg pain that was worsening despite nonsurgical therapies.  He started to develop right leg pain soon after.     There are no problems to display for this patient.  Past Medical History:  Diagnosis Date  . Anxiety   . Depression   . Pre-diabetes   . Sleep apnea    does not use CPAP    Past Surgical History:  Procedure Laterality Date  . BACK SURGERY    . BUNIONECTOMY    . KNEE ARTHROSCOPY    . TONSILLECTOMY      Medications Prior to Admission  Medication Sig Dispense Refill Last Dose  . oxyCODONE (OXY IR/ROXICODONE) 5 MG immediate release tablet Take 5 mg by mouth every 4 (four) hours as needed for severe pain.   12/25/2019 at 0445   No Known Allergies  Social History   Tobacco Use  . Smoking status: Never Smoker  . Smokeless tobacco: Current User  Substance Use Topics  . Alcohol use: Yes    Comment: rare    History reviewed. No pertinent family history.   Review of Systems Pertinent items are noted in HPI.  Objective:   Patient Vitals for the past 8 hrs:  BP Temp Temp src Pulse Resp SpO2 Height Weight  12/25/19 0559 (!) 154/93 98.6 F (37 C) Oral 72 17 98 % 5\' 9"  (1.753 m) 127 kg   No intake/output data recorded. No intake/output data recorded.    BP (!) 154/93   Pulse 72   Temp 98.6 F (37 C) (Oral)   Resp 17   Ht 5\' 9"  (1.753 m)   Wt 127 kg   SpO2 98%   BMI 41.35 kg/m   General Appearance:    Alert, cooperative, no distress, appears stated age  Head:    Normocephalic, without obvious abnormality, atraumatic  Eyes:    PERRL, conjunctiva/corneas clear, EOM's intact, fundi    benign, both eyes       Ears:    Normal TM's and external ear canals, both ears  Nose:   Nares normal, septum midline, mucosa normal, no drainage    or sinus tenderness  Throat:   Lips, mucosa, and tongue normal; teeth and gums  normal  Neck:   Supple, symmetrical, trachea midline, no adenopathy;       thyroid:  No enlargement/tenderness/nodules; no carotid   bruit or JVD  Back:     Symmetric, no curvature, ROM normal, no CVA tenderness  Lungs:     Clear to auscultation bilaterally, respirations unlabored  Chest wall:    No tenderness or deformity  Heart:    Regular rate and rhythm, S1 and S2 normal, no murmur, rub   or gallop  Abdomen:     Soft, non-tender, bowel sounds active all four quadrants,    no masses, no organomegaly  Genitalia:    Normal male without lesion, discharge or tenderness  Rectal:    Normal tone, normal prostate, no masses or tenderness;   guaiac negative stool  Extremities:   Extremities normal, atraumatic, no cyanosis or edema  Pulses:   2+ and symmetric all extremities  Skin:   Skin color, texture, turgor normal, no rashes or lesions  Lymph nodes:   Cervical, supraclavicular, and axillary nodes normal  Neurologic:   CNII-XII intact. Normal strength, sensation  and reflexes      throughout    Data Review    CBC:  Lab Results  Component Value Date   WBC 6.0 12/24/2019   RBC 5.08 12/24/2019   BMP:  Lab Results  Component Value Date   GLUCOSE 89 11/13/2016   CO2 28 11/13/2016   BUN 9 11/13/2016   CREATININE 1.24 11/13/2016   CALCIUM 8.8 (L) 11/13/2016   Radiology review: MRI L-spine shows previous left L4-5 lumbar decompression, recurrent disc herniation with foraminal/extraforaminal nerve root impingement.  There is also moderate right foraminal stenosis at L5-S1.  X-ray shows increased movement of L4-5 spondylolisthesis with flexion/extension  Assessment:  Recurrent lumbar stenosis, L4-5 spondylolisthesis   Plan:   - plan for L4-5 TLIF, possible R L5-S1 foraminotomy

## 2019-12-25 NOTE — Transfer of Care (Signed)
Immediate Anesthesia Transfer of Care Note  Patient: Brent Crawford  Procedure(s) Performed: Transforaminal Lumbar Interbody Fusion  Lumbar  four-five,Right  Lumbar five-Sacral one Foraminotomy (Right )  Patient Location: PACU  Anesthesia Type:General  Level of Consciousness: awake, alert  and oriented  Airway & Oxygen Therapy: Patient Spontanous Breathing and Patient connected to face mask oxygen  Post-op Assessment: Report given to RN and Post -op Vital signs reviewed and stable  Post vital signs: Reviewed and stable  Last Vitals:  Vitals Value Taken Time  BP 127/78 12/25/19 1422  Temp    Pulse 97 12/25/19 1423  Resp 20 12/25/19 1423  SpO2 100 % 12/25/19 1423  Vitals shown include unvalidated device data.  Last Pain:  Vitals:   12/25/19 0617  TempSrc:   PainSc: 5       Patients Stated Pain Goal: 6 (12/25/19 0617)  Complications: No complications documented.

## 2019-12-25 NOTE — Op Note (Signed)
PREOP DIAGNOSIS: Lumbar stenosis and spondylolisthesis L4-5 POSTOP DIAGNOSIS: Lumbar stenosis and spondylolisthesis L4-5  PROCEDURE: 1. L4-5 lumbar interbody fusion via transforaminal approach 2. Posterolateral arthrodesis L4-5 3. Laminectomy, bilateral facetectomies and foraminotomies with bilateral discectomies at L4-5 4. Right L5-S1 foraminotomy 5. Placement of interbody cage L4-5 6. Nonsegmental instrumentation with percutaneously placed pedicle screw and rod construct at L4-5 7. Harvest of local autograft 8. Use of morselized allograft 9. 22 modifier for unusually difficult/complex procedure.  SURGEON: Dr. Hoyt Koch, MD  ASSISTANT: Dr. Lisbeth Renshaw.   Please note, there were no qualified trainees available to assist with the procedure.  An assistant was required for aid in retraction of the neural elements.   ANESTHESIA: General Endotracheal  EBL: 150 ml  IMPLANTS: Stryker 7.5 x 45 mm Everest pedicle screws x 4, 35 mm/3mm rods, ProLift 10 mm x 8 mm 12 degree lordotic expandable cage, DBF 6 ml  SPECIMENS: None  DRAINS: None  COMPLICATIONS: none  CONDITION: Stable to PACU  HISTORY: Brent Crawford is a 49 y.o. male with a history of left L4-5 decompression and discectomy who initially presented to the outpatient clinic with mechanical back pain and left leg pain.  He was found to have recurrent disc herniation at L4-5 in the foramen, as well as spondylolisthesis which worsened on flexion and standing.  Nonsurgical measures failed to improve his pain and in fact his pain progressed over time.  He then began to have right-sided leg pain.  Surgical options were discussed with him and L4-5 TLIF, with a small foraminotomy at R L5-S1 was recommended.   Risks, benefits, alternatives, expected convalescence were discussed with him.  Risks discussed included but were not limited to bleeding, pain, infection, scar, pseudoarthrosis, CSF leak, neurologic deficit, paralysis, and  death.  The patient wished to proceed with surgery and informed consent was obtained.  PROCEDURE IN DETAIL: After informed consent was obtained and witnessed, the patient was brought to the operating room. After induction of general anesthesia, the patient was positioned on the operative table in the prone position on a Wilson frame with all pressure points meticulously padded. The skin of the low back was then prepped and draped in the usual sterile fashion.  1% lidocaine with epinephrine was injected into the planned incision.  The previous incision was opened and extended superiorly and inferiorly.  Subcutaneous tissue and fascia were divided and the paraspinous musculature was dissected off of the L4 and superior L5 lamina as well as the inferior L3 lamina in subperiosteal fashion.  Dissection continued out laterally and the transverse processes of L4 and L5 were exposed and decorticated.  Laminectomy was then performed at L4, leaving the superior third of the lamina and autograft was harvested.  There was tightly adhering scar over the left L4 laminotomy defect which extended out into the foramen.  As was seen on the preoperative MRI, the previous surgeon had removed his left L4 inferior facet.  However, it was quite difficult to dissected the tightly adhering scar off of the remaining superior articulating process of L5 on the left side.  Scar was left along the left L5 nerve root shoulder. Dense scar was dissected in the foramen until disc material was reached and removed.  Annular defect from the disc herniation was identified.  Increasing size shavers were then placed in the interbody space under x-ray guidance and used to loosen disc fragments up.  The interbody space was prepared with combination of rongeurs and curettes.  A right-sided facetectomy was  then performed and the traversing nerve root was followed out the L5-S1 foramina by removing the superior portion of L5 lamina.  There was adhesions  even on this side that were tightly adherent to the dura.  Therefore a modest foraminotomy was performed as to minimize the risk of spinal fluid leak.  However, there appeared to be good decompression with passage of nerve hook into the foramen.  The right-sided L4-5 disc space was exposed and a annulotomy performed with a 15 blade and remaining disc material which was modest was removed with combination of rongeurs and shavers.  Autograft was mixed with DBF allograft and placed in the interbody space on the right side and then the left side.  A size 8 mm x 10 mm x 28 mm interbody spacer spacer was then tamped in place under x-ray guidance.  Using x-ray, pedicle screws were placed at L4 and L5 bilaterally by first performing a pilot hole, followed by a pedicle finder, with feeler confirming good body channel.  Finally the hole was tapped and 7.5 x 45 mm screws were placed with good purchase.  Rods were then passed through the screw heads bilaterally and secured with screw caps.  X-ray confirmed good placement.  Wounds were irrigated thoroughly with bacitracin impregnated irrigation.  Exparel mixed with Marcaine was injected into the paraspinous muscles and subcutaneous tissues bilaterally.  A 10 flat JP drain was placed. The fascia was closed with 0 Vicryl stitches.  The dermal layer was closed with 2-0 Vicryl stitches in buried interrupted fashion.  The skin incisions were closed with 4-0 Monocryl subcuticular manner followed by Dermabond.  Sterile dressings were placed.  Patient was then flipped supine and extubated by the anesthesia service following commands and all 4 extremities.  All counts were correct at the end of surgery.  No complications were noted.  Due to the amount of densely adherent scar tissue present as well as the patient's body habitus (BMI > 40), this was an unusually complex and difficult procedure, as reflected in the longer operative time.

## 2019-12-25 NOTE — Anesthesia Procedure Notes (Signed)
Procedure Name: Intubation Date/Time: 12/25/2019 8:15 AM Performed by: Pearson Grippe, CRNA Pre-anesthesia Checklist: Patient identified, Emergency Drugs available, Suction available and Patient being monitored Patient Re-evaluated:Patient Re-evaluated prior to induction Oxygen Delivery Method: Circle system utilized Preoxygenation: Pre-oxygenation with 100% oxygen Induction Type: IV induction Ventilation: Oral airway inserted - appropriate to patient size and Two handed mask ventilation required Laryngoscope Size: Miller and 2 Grade View: Grade II Tube type: Oral Tube size: 7.5 mm Number of attempts: 1 Airway Equipment and Method: Stylet and Oral airway Placement Confirmation: ETT inserted through vocal cords under direct vision,  positive ETCO2 and breath sounds checked- equal and bilateral Secured at: 23 cm Tube secured with: Tape Dental Injury: Teeth and Oropharynx as per pre-operative assessment

## 2019-12-25 NOTE — Anesthesia Postprocedure Evaluation (Signed)
Anesthesia Post Note  Patient: Brent Crawford  Procedure(s) Performed: Transforaminal Lumbar Interbody Fusion  Lumbar  four-five,Right  Lumbar five-Sacral one Foraminotomy (Right )     Patient location during evaluation: PACU Anesthesia Type: General Level of consciousness: sedated and patient cooperative Pain management: pain level controlled Vital Signs Assessment: post-procedure vital signs reviewed and stable Respiratory status: spontaneous breathing Cardiovascular status: stable Anesthetic complications: no   No complications documented.  Last Vitals:  Vitals:   12/25/19 1509 12/25/19 1545  BP: 110/68 112/62  Pulse: 85 83  Resp: 12 18  Temp: 36.8 C 36.4 C  SpO2: 97% 96%    Last Pain:  Vitals:   12/25/19 1545  TempSrc: Oral  PainSc:                  [name]

## 2019-12-26 ENCOUNTER — Encounter (HOSPITAL_COMMUNITY): Payer: Self-pay | Admitting: Neurosurgery

## 2019-12-26 MED ORDER — METHOCARBAMOL 750 MG PO TABS: 750.0000 mg | ORAL_TABLET | Freq: Four times a day (QID) | ORAL | Status: DC | PRN

## 2019-12-26 MED ORDER — TIZANIDINE HCL 4 MG PO TABS
4.0000 mg | ORAL_TABLET | Freq: Four times a day (QID) | ORAL | Status: DC | PRN
  Administered 2019-12-26 – 2019-12-27 (×3): 4 mg via ORAL
  Filled 2019-12-26 (×3): qty 1

## 2019-12-26 NOTE — Progress Notes (Signed)
Subjective: Patient reports his leg pain is improved but has significant back soreness  Objective: Vital signs in last 24 hours: Temp:  [97.6 F (36.4 C)-99.2 F (37.3 C)] 97.7 F (36.5 C) (10/29 0806) Pulse Rate:  [67-101] 67 (10/29 0806) Resp:  [12-20] 19 (10/29 0806) BP: (104-142)/(54-79) 128/65 (10/29 0806) SpO2:  [93 %-100 %] 94 % (10/29 0806)  Intake/Output from previous day: 10/28 0701 - 10/29 0700 In: 3130 [P.O.:480; I.V.:2400; IV Piggyback:250] Out: 1080 [Urine:480; Drains:200; Blood:400] Intake/Output this shift: No intake/output data recorded.  Awake, alert NAD Dressing c/d Drain output 140 ml, serosanguinous  Lab Results: Recent Labs    12/24/19 1021  WBC 6.0  HGB 12.4*  HCT 41.7  PLT 246   BMET No results for input(s): NA, K, CL, CO2, GLUCOSE, BUN, CREATININE, CALCIUM in the last 72 hours.  Studies/Results: DG Lumbar Spine 2-3 Views  Result Date: 12/25/2019 CLINICAL DATA:  Surgery, elective. Additional history provided: L4-L5 TLIF and L5-S1 foraminotomy. Provided fluoroscopy time: 1 minutes, 15 seconds (122.07 mGy). EXAM: LUMBAR SPINE - 2-3 VIEW; DG C-ARM 1-60 MIN COMPARISON:  Radiographs of the lumbar spine 05/09/2019. FINDINGS: PA and lateral view intraoperative fluoroscopic images of the lumbar spine are submitted, 2 images total. The images demonstrate bilateral pedicle screws at L4 and L5, as well as an L4-L5 interbody spacer. Overlying retractors. IMPRESSION: Two intraoperative fluoroscopic images of the lumbar spine as described. Electronically Signed   By: Jackey Loge DO   On: 12/25/2019 13:44   DG C-Arm 1-60 Min  Result Date: 12/25/2019 CLINICAL DATA:  Surgery, elective. Additional history provided: L4-L5 TLIF and L5-S1 foraminotomy. Provided fluoroscopy time: 1 minutes, 15 seconds (122.07 mGy). EXAM: LUMBAR SPINE - 2-3 VIEW; DG C-ARM 1-60 MIN COMPARISON:  Radiographs of the lumbar spine 05/09/2019. FINDINGS: PA and lateral view intraoperative  fluoroscopic images of the lumbar spine are submitted, 2 images total. The images demonstrate bilateral pedicle screws at L4 and L5, as well as an L4-L5 interbody spacer. Overlying retractors. IMPRESSION: Two intraoperative fluoroscopic images of the lumbar spine as described. Electronically Signed   By: Jackey Loge DO   On: 12/25/2019 13:44    Assessment/Plan: POD#1 s/p L4-5 TLIF - continue JP drain - likely DC tomorrow   LOS: 1 day     Bedelia Person 12/26/2019, 9:00 AM

## 2019-12-26 NOTE — Evaluation (Addendum)
Occupational Therapy Evaluation Patient Details Name: Brent Crawford MRN: [id number] DOB: January 11, 1971 Today's Date: 12/26/2019    History of Present Illness 49 yo male s/p L4-5 TLIF PMH chewing tobacco hx, OSA, prediabetic, anxiety, depression, back surg 2016 obese knee arthoscopy   Clinical Impression   Patient is s/p TLIF L4-5  surgery resulting in functional limitations due to the deficits listed below (see OT problem list). Pt currently with pain and decreased activity tolerance. Educated on LB dressing and use of AE this session. Pt returned to supine due to discomfort and next OT session to focus on shower transfer / dress for d/c with AE.  Patient will benefit from skilled OT acutely to increase independence and safety with ADLS to allow discharge home.     Follow Up Recommendations  No OT follow up    Equipment Recommendations  None recommended by OT    Recommendations for Other Services       Precautions / Restrictions Precautions Precautions: Back Precaution Comments: handout provided and reviewed in detail for adls. Required Braces or Orthoses: Spinal Brace Spinal Brace: Lumbar corset;Applied in sitting position Restrictions Weight Bearing Restrictions: No      Mobility Bed Mobility Overal bed mobility: Needs Assistance Bed Mobility: Sit to Supine       Sit to supine: Min guard   General bed mobility comments: min cues to sequence task but able to return to bed side lying without physical (A)     Transfers                 General transfer comment: min guard    Balance                                           ADL either performed or assessed with clinical judgement   ADL Overall ADL's : Needs assistance/impaired Eating/Feeding: Independent   Grooming: Wash/dry hands;Wash/dry face;Oral care;Min guard Grooming Details (indicate cue type and reason): educated on cup method and wash cloth at sink level to maintain back  precautiosn         Upper Body Dressing : Modified independent Upper Body Dressing Details (indicate cue type and reason): don doff brace Lower Body Dressing: Minimal assistance;Adhering to back precautions;With adaptive equipment Lower Body Dressing Details (indicate cue type and reason): educated on use of AE for LB dressing. pt requires extra wide sock aide for socks,    Toilet Transfer Details (indicate cue type and reason): eduated on toilet aide and return demo            General ADL Comments: pt finishing PT session and returning to be on arrival. pt continued OT session at EOB and then return to supine. pt with 3/4 th full JP drain at this time and RN made aware. Pt's personal photo used to take photo of dressing for pt and pt able to visual surgerical site.      Vision Baseline Vision/History: No visual deficits       Perception     Praxis      Pertinent Vitals/Pain Pain Assessment: Faces Faces Pain Scale: Hurts whole lot Pain Location: back Pain Descriptors / Indicators: Operative site guarding Pain Intervention(s): Premedicated before session;Monitored during session;Repositioned     Hand Dominance Right   Extremity/Trunk Assessment Upper Extremity Assessment Upper Extremity Assessment: Overall WFL for tasks assessed   Lower Extremity Assessment Lower Extremity Assessment:  Defer to PT evaluation   Cervical / Trunk Assessment Cervical / Trunk Assessment: Other exceptions (s/p surg)   Communication Communication Communication: No difficulties   Cognition Arousal/Alertness: Awake/alert Behavior During Therapy: WFL for tasks assessed/performed Overall Cognitive Status: Within Functional Limits for tasks assessed                                     General Comments  dressing reinforced with extra dressing and noted to have wet drainage near JP tube.     Exercises     Shoulder Instructions      Home Living Family/patient expects to  be discharged to:: Private residence Living Arrangements: Spouse/significant other Available Help at Discharge: Family;Available PRN/intermittently Type of Home: House             Bathroom Shower/Tub: Walk-in Soil scientist Toilet: Handicapped height     Home Equipment: Shower seat   Additional Comments: reports 3 dogs in the home but only cares for one named "Brent Crawford"  (other dogs are daughters and stay on her side of the house)      Prior Functioning/Environment Level of Independence: Independent                 OT Problem List: Decreased strength;Decreased activity tolerance;Impaired balance (sitting and/or standing);Decreased safety awareness;Decreased knowledge of use of DME or AE;Decreased knowledge of precautions;Obesity;Pain      OT Treatment/Interventions: Self-care/ADL training;Therapeutic exercise;Neuromuscular education;Energy conservation;DME and/or AE instruction;Manual therapy;Therapeutic activities;Patient/family education;Balance training    OT Goals(Current goals can be found in the care plan section) Acute Rehab OT Goals Patient Stated Goal: to have less pain OT Goal Formulation: With patient Time For Goal Achievement: 01/09/20 Potential to Achieve Goals: Good ADL Goals Pt Will Perform Lower Body Dressing: with modified independence;sit to/from stand;with adaptive equipment Pt Will Perform Tub/Shower Transfer: Shower transfer;ambulating;rolling walker;shower seat  OT Frequency: Min 2X/week   Barriers to D/C:            Co-evaluation              AM-PAC OT "6 Clicks" Daily Activity     Outcome Measure Help from another person eating meals?: None Help from another person taking care of personal grooming?: A Little Help from another person toileting, which includes using toliet, bedpan, or urinal?: A Little Help from another person bathing (including washing, rinsing, drying)?: A Little Help from another person to put on and taking off  regular upper body clothing?: None Help from another person to put on and taking off regular lower body clothing?: A Little 6 Click Score: 20   End of Session Equipment Utilized During Treatment: Back brace Nurse Communication: Mobility status;Precautions  Activity Tolerance: Patient tolerated treatment well Patient left: in bed;with call bell/phone within reach  OT Visit Diagnosis: Unsteadiness on feet (R26.81);Muscle weakness (generalized) (M62.81);Pain                Time: 7989-2119 OT Time Calculation (min): 28 min Charges:  OT General Charges $OT Visit: 1 Visit OT Evaluation $OT Eval Moderate Complexity: 1 Mod   Brynn, OTR/L  Acute Rehabilitation Services Pager: [phone] Office: [phone] .   Mateo Flow 12/26/2019, 10:51 AM

## 2019-12-26 NOTE — Evaluation (Signed)
Physical Therapy Evaluation Patient Details Name: Brent Crawford MRN: [id number] DOB: 09/15/1970 Today's Date: 12/26/2019   History of Present Illness  49 yo male s/p L4-5 TLIF PMH chewing tobacco hx, OSA, prediabetic, anxiety, depression, back surg 2016 obese knee arthoscopy    Clinical Impression  Pt admitted with above diagnosis. At the time of PT eval, pt was able to demonstrate transfers and ambulation with gross supervision for safety with RW for support. Pt was educated on precautions, brace application/wearing schedule, appropriate activity progression, and car transfer. Pt currently with functional limitations due to the deficits listed below (see PT Problem List). Pt will benefit from skilled PT to increase their independence and safety with mobility to allow discharge to the venue listed below.      Follow Up Recommendations Home health PT;Supervision for mobility/OOB    Equipment Recommendations  Rolling walker with 5" wheels    Recommendations for Other Services OT consult     Precautions / Restrictions Precautions Precautions: Back Precaution Comments: handout provided and reviewed in detail for adls. Required Braces or Orthoses: Spinal Brace Spinal Brace: Lumbar corset;Applied in sitting position Restrictions Weight Bearing Restrictions: No      Mobility  Bed Mobility Overal bed mobility: Needs Assistance Bed Mobility: Sidelying to Sit   Sidelying to sit: Supervision   Sit to supine: Min guard   General bed mobility comments: No assist to transition to sitting position however VC's for technique provided.     Transfers Overall transfer level: Needs assistance Equipment used: Rolling walker (2 wheeled) Transfers: Sit to/from Stand Sit to Stand: Min guard         General transfer comment: Close guard for safety as pt powered up to full standing position.   Ambulation/Gait Ambulation/Gait assistance: Min guard Gait Distance (Feet): 300 Feet Assistive  device: Rolling walker (2 wheeled) Gait Pattern/deviations: Step-through pattern;Decreased stride length;Staggering left Gait velocity: Decreased Gait velocity interpretation: <1.31 ft/sec, indicative of household ambulator General Gait Details: Very slow and guarded with occasional bouts of unsteadiness. Initially attemped without the RW per pt request, however pt safest with BUE support.  Stairs Stairs: Yes Stairs assistance: Min guard; Min assist Stair Management: One rail Right;Sideways Number of Stairs: 5 General stair comments: VC's for sequencing and general safety   Wheelchair Mobility    Modified Rankin (Stroke Patients Only)       Balance Overall balance assessment: Needs assistance Sitting-balance support: Feet supported;No upper extremity supported Sitting balance-Leahy Scale: Fair     Standing balance support: During functional activity;No upper extremity supported Standing balance-Leahy Scale: Poor Standing balance comment: Reliant on UE support grossly                             Pertinent Vitals/Pain Pain Assessment: Faces Faces Pain Scale: Hurts whole lot Pain Location: back Pain Descriptors / Indicators: Operative site guarding Pain Intervention(s): Premedicated before session;Monitored during session;Repositioned    Home Living Family/patient expects to be discharged to:: Private residence Living Arrangements: Spouse/significant other Available Help at Discharge: Family;Available PRN/intermittently Type of Home: House Home Access: Stairs to enter     Home Layout: One level Home Equipment: Shower seat Additional Comments: reports 3 dogs in the home but only cares for one named "Brent Crawford"  (other dogs are daughters and stay on her side of the house)    Prior Function Level of Independence: Independent               Hand  Dominance   Dominant Hand: Right    Extremity/Trunk Assessment   Upper Extremity Assessment Upper Extremity  Assessment: Defer to OT evaluation    Lower Extremity Assessment Lower Extremity Assessment: Generalized weakness    Cervical / Trunk Assessment Cervical / Trunk Assessment: Other exceptions (s/p surg)  Communication   Communication: No difficulties  Cognition Arousal/Alertness: Awake/alert Behavior During Therapy: WFL for tasks assessed/performed Overall Cognitive Status: Within Functional Limits for tasks assessed                                        General Comments General comments (skin integrity, edema, etc.): dressing reinforced with extra dressing and noted to have wet drainage near JP tube.     Exercises     Assessment/Plan    PT Assessment Patient needs continued PT services  PT Problem List Decreased strength;Decreased activity tolerance;Decreased balance;Decreased mobility;Decreased coordination;Decreased knowledge of use of DME;Decreased safety awareness;Decreased knowledge of precautions;Pain       PT Treatment Interventions DME instruction;Gait training    PT Goals (Current goals can be found in the Care Plan section)  Acute Rehab PT Goals Patient Stated Goal: to have less pain PT Goal Formulation: With patient Time For Goal Achievement: 01/02/20 Potential to Achieve Goals: Good    Frequency Min 5X/week   Barriers to discharge        Co-evaluation               AM-PAC PT "6 Clicks" Mobility  Outcome Measure Help needed turning from your back to your side while in a flat bed without using bedrails?: None Help needed moving from lying on your back to sitting on the side of a flat bed without using bedrails?: A Little Help needed moving to and from a bed to a chair (including a wheelchair)?: A Little Help needed standing up from a chair using your arms (e.g., wheelchair or bedside chair)?: A Little Help needed to walk in hospital room?: A Little Help needed climbing 3-5 steps with a railing? : A Little 6 Click Score: 19     End of Session Equipment Utilized During Treatment: Gait belt;Cervical collar Activity Tolerance: Patient tolerated treatment well Patient left: in chair;with call bell/phone within reach Nurse Communication: Mobility status PT Visit Diagnosis: Unsteadiness on feet (R26.81);Pain Pain - part of body:  (back)    Time: 3267-1245 PT Time Calculation (min) (ACUTE ONLY): 37 min   Charges:   PT Evaluation $PT Eval Low Complexity: 1 Low PT Treatments $Gait Training: 8-22 mins        Conni Slipper, PT, DPT Acute Rehabilitation Services Pager: [phone] Office: [phone]   Marylynn Pearson 12/26/2019, 1:50 PM

## 2019-12-27 NOTE — Progress Notes (Signed)
Physical Therapy Treatment Patient Details Name: Brent Crawford MRN: [id number] DOB: 28-Oct-1970 Today's Date: 12/27/2019    History of Present Illness 49 yo male s/p L4-5 TLIF PMH chewing tobacco hx, OSA, prediabetic, anxiety, depression, back surg 2016 obese knee arthoscopy    PT Comments    The pt is making good progress with mobility and PT goals this morning. He was able to complete good hallway ambulation and navigate 1 flight of stairs with minG for safety and use of UE support on RW or rails. The pt continues to rely on RW for stability with gait, and will continue to benefit from skilled PT following d/c to progress activity tolerance, strength, and stability to reduce dependence on AD. The pt is however, safe to d/c home with family assist/support, as all education is complete and all questions were answered at this time.     Follow Up Recommendations  Home health PT;Supervision for mobility/OOB     Equipment Recommendations  Rolling walker with 5" wheels    Recommendations for Other Services       Precautions / Restrictions Precautions Precautions: Back Precaution Booklet Issued: Yes (comment) Precaution Comments: pt able to teach back 3/3 precautions, demo adherence with mobility Required Braces or Orthoses: Spinal Brace Spinal Brace: Lumbar corset;Applied in sitting position Restrictions Weight Bearing Restrictions: No    Mobility  Bed Mobility Overal bed mobility: Needs Assistance Bed Mobility: Rolling;Sidelying to Sit;Sit to Sidelying Rolling: Supervision Sidelying to sit: Supervision     Sit to sidelying: Supervision General bed mobility comments: good adherence to log roll, good safety awareness  Transfers Overall transfer level: Needs assistance Equipment used: None Transfers: Sit to/from Stand Sit to Stand: Supervision Stand pivot transfers: Supervision       General transfer comment: pt able to power up and steady without assist, reaching for RW  after completing stand  Ambulation/Gait Ambulation/Gait assistance: Min guard Gait Distance (Feet): 300 Feet Assistive device: Rolling walker (2 wheeled) Gait Pattern/deviations: Step-through pattern;Decreased stride length;Trunk flexed Gait velocity: 0.44 m/s Gait velocity interpretation: <1.8 ft/sec, indicate of risk for recurrent falls General Gait Details: pt continues to prefer use of RW, good stability with use. decreased stride length with slight trunk flexion, but improved with cues   Stairs Stairs: Yes Stairs assistance: Min guard Stair Management: One rail Right;Forwards;Alternating pattern Number of Stairs: 10 General stair comments: VC for general safety, pt able to complete without LOB      Balance Overall balance assessment: Needs assistance Sitting-balance support: Feet supported;No upper extremity supported Sitting balance-Leahy Scale: Fair     Standing balance support: During functional activity;No upper extremity supported Standing balance-Leahy Scale: Fair Standing balance comment: Reliant on UE support grossly                            Cognition Arousal/Alertness: Awake/alert Behavior During Therapy: WFL for tasks assessed/performed Overall Cognitive Status: Within Functional Limits for tasks assessed                                        Exercises      General Comments General comments (skin integrity, edema, etc.): minimal cues for back precautions with donnning of brace. pt and wife report no further questions      Pertinent Vitals/Pain Pain Assessment: 0-10 Pain Score: 4  (increased to 7/10 following stairs) Pain Location: back Pain  Descriptors / Indicators: Operative site guarding Pain Intervention(s): Monitored during session;Limited activity within patient's tolerance;Patient requesting pain meds-RN notified           PT Goals (current goals can now be found in the care plan section) Acute Rehab PT  Goals Patient Stated Goal: to have less pain PT Goal Formulation: With patient Time For Goal Achievement: 01/02/20 Potential to Achieve Goals: Good Progress towards PT goals: Progressing toward goals    Frequency    Min 5X/week      PT Plan Current plan remains appropriate       AM-PAC PT "6 Clicks" Mobility   Outcome Measure  Help needed turning from your back to your side while in a flat bed without using bedrails?: None Help needed moving from lying on your back to sitting on the side of a flat bed without using bedrails?: A Little Help needed moving to and from a bed to a chair (including a wheelchair)?: A Little Help needed standing up from a chair using your arms (e.g., wheelchair or bedside chair)?: A Little Help needed to walk in hospital room?: A Little Help needed climbing 3-5 steps with a railing? : A Little 6 Click Score: 19    End of Session Equipment Utilized During Treatment: Gait belt;Back brace Activity Tolerance: Patient tolerated treatment well Patient left: in bed;with call bell/phone within reach;with family/visitor present Nurse Communication: Mobility status;Patient requests pain meds PT Visit Diagnosis: Unsteadiness on feet (R26.81);Pain Pain - part of body:  (back)     Time: 1950-9326 PT Time Calculation (min) (ACUTE ONLY): 15 min  Charges:  $Gait Training: 8-22 mins                     Rolm Baptise, PT, DPT   Acute Rehabilitation Department Pager #: [phone]   Gaetana Michaelis 12/27/2019, 9:46 AM

## 2019-12-27 NOTE — Progress Notes (Signed)
Occupational Therapy Treatment Patient Details Name: Brent Crawford MRN: [id number] DOB: 1970-08-17 Today's Date: 12/27/2019    History of present illness 49 yo male s/p L4-5 TLIF PMH chewing tobacco hx, OSA, prediabetic, anxiety, depression, back surg 2016 obese knee arthoscopy   OT comments  Pt is able to perform ADLs with supervision/set up.  He requires min cues to avoid twisting when gathering items.  He is aware of where to acquire AE.  All education completed.    Follow Up Recommendations  No OT follow up    Equipment Recommendations  None recommended by OT    Recommendations for Other Services      Precautions / Restrictions Precautions Precautions: Back Precaution Comments: handout provided and reviewed in detail for adls. Required Braces or Orthoses: Spinal Brace Spinal Brace: Lumbar corset;Applied in sitting position       Mobility Bed Mobility Overal bed mobility: Needs Assistance Bed Mobility: Rolling;Sidelying to Sit;Sit to Sidelying Rolling: Supervision Sidelying to sit: Supervision     Sit to sidelying: Supervision General bed mobility comments: pt demonstrates good safety awareness   Transfers Overall transfer level: Needs assistance Equipment used: Rolling walker (2 wheeled);None Transfers: Sit to/from [employer] to Stand: Supervision Stand pivot transfers: Supervision       General transfer comment: verbal cues for hand placement with use of RW     Balance Overall balance assessment: Needs assistance Sitting-balance support: Feet supported;No upper extremity supported Sitting balance-Leahy Scale: Fair     Standing balance support: During functional activity;No upper extremity supported Standing balance-Leahy Scale: Fair                             ADL either performed or assessed with clinical judgement   ADL Overall ADL's : Needs assistance/impaired Eating/Feeding: Independent     Grooming Details  (indicate cue type and reason): reiterated safety with grooming          Upper Body Dressing : Set up;Sitting   Lower Body Dressing: Supervision/safety;Sit to/from stand;Adhering to back precautions;With adaptive equipment Lower Body Dressing Details (indicate cue type and reason): Pt demonstrates good understanding of use of AE for LB ADLs, and is able to don pants with use of reacher            Tub/Shower Transfer Details (indicate cue type and reason): verbally instructed pt on safety with shower transfer.  He verbalized understanding  Functional mobility during ADLs: Supervision/safety General ADL Comments: Pt performed simulated shower in standing with supervision using LH sponge for LB bathing.  He was instructed where to acquire AE and says he will order it from [employer].  Discussed safety with ADLs      Vision       Perception     Praxis      Cognition Arousal/Alertness: Awake/alert Behavior During Therapy: WFL for tasks assessed/performed Overall Cognitive Status: Within Functional Limits for tasks assessed                                          Exercises     Shoulder Instructions       General Comments Pt requires min cues to avoid twisting to gather ADL supplies.  Reviewed safe activity for home     Pertinent Vitals/ Pain       Pain Assessment: 0-10 Pain Score: 3  Pain Location: back Pain Descriptors / Indicators: Operative site guarding Pain Intervention(s): Monitored during session;Premedicated before session  Home Living                                          Prior Functioning/Environment              Frequency  Min 2X/week        Progress Toward Goals  OT Goals(current goals can now be found in the care plan section)  Progress towards OT goals: Progressing toward goals     Plan Discharge plan remains appropriate    Co-evaluation                 AM-PAC OT "6 Clicks" Daily Activity      Outcome Measure   Help from another person eating meals?: None Help from another person taking care of personal grooming?: A Little Help from another person toileting, which includes using toliet, bedpan, or urinal?: A Little Help from another person bathing (including washing, rinsing, drying)?: A Little Help from another person to put on and taking off regular upper body clothing?: None Help from another person to put on and taking off regular lower body clothing?: A Little 6 Click Score: 20    End of Session Equipment Utilized During Treatment: Rolling walker  OT Visit Diagnosis: Unsteadiness on feet (R26.81);Muscle weakness (generalized) (M62.81);Pain Pain - part of body:  (backj)   Activity Tolerance Patient tolerated treatment well   Patient Left in bed;with call bell/phone within reach;with family/visitor present   Nurse Communication Mobility status        Time: 0300-9233 OT Time Calculation (min): 19 min  Charges: OT General Charges $OT Visit: 1 Visit OT Treatments $Self Care/Home Management : 8-22 mins  Eber Jones OTR/L Acute Rehabilitation Services Pager [phone] Office [phone]    Jeani Hawking M 12/27/2019, 8:30 AM

## 2019-12-27 NOTE — Progress Notes (Signed)
Patient alert and oriented, mae's well, voiding adequate amount of urine, swallowing without difficulty, no c/o pain at time of discharge. Patient discharged home with family. Discharged instructions given to patient. Patient and family stated understanding of instructions given. Patient has an appointment with Dr. Maisie Fus

## 2019-12-27 NOTE — Discharge Summary (Signed)
Physician Discharge Summary  Patient ID: Anurag Scarfo MRN: [id number] DOB/AGE: 08-26-1970 49 y.o.  Admit date: 12/25/2019 Discharge date: 12/27/2019  Admission Diagnoses: Lumbar stenosis and spondylolisthesis L4-5   Discharge Diagnoses:same   Discharged Condition: good  Hospital Course: The patient was admitted on 12/25/2019 and taken to the operating room where the patient underwent PLIF L4-5. The patient tolerated the procedure well and was taken to the recovery room and then to the floor in stable condition. The hospital course was routine. There were no complications. The wound remained clean dry and intact. Pt had appropriate back soreness. No complaints of leg pain or new N/T/W. The patient remained afebrile with stable vital signs, and tolerated a regular diet. The patient continued to increase activities, and pain was well controlled with oral pain medications.   Consults: None  Significant Diagnostic Studies:  Results for orders placed or performed during the hospital encounter of 12/25/19  Glucose, capillary  Result Value Ref Range   Glucose-Capillary 106 (H) 70 - 99 mg/dL  ABO/Rh  Result Value Ref Range   ABO/RH(D)      O POS Performed at [facility], [address], [place], [place] [address]     DG Lumbar Spine 2-3 Views  Result Date: 12/25/2019 CLINICAL DATA:  Surgery, elective. Additional history provided: L4-L5 TLIF and L5-S1 foraminotomy. Provided fluoroscopy time: 1 minutes, 15 seconds (122.07 mGy). EXAM: LUMBAR SPINE - 2-3 VIEW; DG C-ARM 1-60 MIN COMPARISON:  Radiographs of the lumbar spine 05/09/2019. FINDINGS: PA and lateral view intraoperative fluoroscopic images of the lumbar spine are submitted, 2 images total. The images demonstrate bilateral pedicle screws at L4 and L5, as well as an L4-L5 interbody spacer. Overlying retractors. IMPRESSION: Two intraoperative fluoroscopic images of the lumbar spine as described. Electronically Signed   By: Jackey Loge DO   On: 12/25/2019 13:44   DG C-Arm 1-60 Min  Result Date: 12/25/2019 CLINICAL DATA:  Surgery, elective. Additional history provided: L4-L5 TLIF and L5-S1 foraminotomy. Provided fluoroscopy time: 1 minutes, 15 seconds (122.07 mGy). EXAM: LUMBAR SPINE - 2-3 VIEW; DG C-ARM 1-60 MIN COMPARISON:  Radiographs of the lumbar spine 05/09/2019. FINDINGS: PA and lateral view intraoperative fluoroscopic images of the lumbar spine are submitted, 2 images total. The images demonstrate bilateral pedicle screws at L4 and L5, as well as an L4-L5 interbody spacer. Overlying retractors. IMPRESSION: Two intraoperative fluoroscopic images of the lumbar spine as described. Electronically Signed   By: Jackey Loge DO   On: 12/25/2019 13:44    Antibiotics:  Anti-infectives (From admission, onward)   Start     Dose/Rate Route Frequency Ordered Stop   12/25/19 2200  ceFAZolin (ANCEF) IVPB 2g/100 mL premix        2 g 200 mL/hr over 30 Minutes Intravenous Every 8 hours 12/25/19 1652 12/26/19 1453   12/25/19 1630  ceFAZolin (ANCEF) IVPB 2g/100 mL premix  Status:  Discontinued        2 g 200 mL/hr over 30 Minutes Intravenous Every 8 hours 12/25/19 1532 12/25/19 1702   12/25/19 0600  ceFAZolin (ANCEF) 3 g in dextrose 5 % 50 mL IVPB        3 g 100 mL/hr over 30 Minutes Intravenous On call to O.R. 12/24/19 1034 12/25/19 0851      Discharge Exam: Blood pressure 116/76, pulse 77, temperature 98.1 F (36.7 C), temperature source Oral, resp. rate 16, height 5\' 9"  (1.753 m), weight 127 kg, SpO2 97 %. Neurologic: Grossly normal Ambulating and  voiding well, incision cdi  Discharge Medications:   Allergies as of 12/27/2019   No Known Allergies     Medication List    TAKE these medications   oxyCODONE 5 MG immediate release tablet Commonly known as: Oxy IR/ROXICODONE Take 5 mg by mouth every 4 (four) hours as needed for severe pain.       Disposition: home   Final Dx: PLIF L4-5  Discharge Instructions      Remove dressing in 72 hours   Complete by: As directed    Call MD for:  difficulty breathing, headache or visual disturbances   Complete by: As directed    Call MD for:  hives   Complete by: As directed    Call MD for:  persistant dizziness or light-headedness   Complete by: As directed    Call MD for:  persistant nausea and vomiting   Complete by: As directed    Call MD for:  redness, tenderness, or signs of infection (pain, swelling, redness, odor or green/yellow discharge around incision site)   Complete by: As directed    Call MD for:  severe uncontrolled pain   Complete by: As directed    Call MD for:  temperature >100.4   Complete by: As directed    Diet - low sodium heart healthy   Complete by: As directed    Driving Restrictions   Complete by: As directed    No driving for 2 weeks, no riding in the car for 1 week   Incentive spirometry RT   Complete by: As directed    Increase activity slowly   Complete by: As directed    Lifting restrictions   Complete by: As directed    No lifting more than 8 lbs       Follow-up Information    [facility] Follow up.   Specialty: Home Health Services Why: Your home health has been set upm with [facility]. The agency will contact you with start of service date. If you have any questions please call number listed above.  Contact information: [address] [place] [address] [phone]                Signed: Tiana Loft [facility] 12/27/2019, 8:39 AM

## 2019-12-27 NOTE — Discharge Instructions (Signed)
Wound Care Remove outer dressing in 2-3 days Leave incision open to air. You may shower. Do not scrub directly on incision.  Do not put any creams, lotions, or ointments on incision. Activity Walk each and every day, increasing distance each day. No lifting greater than 5 lbs.  Avoid bending, arching, and twisting. No driving for 2 weeks; may ride as a passenger locally. If provided with back brace, wear when out of bed.  It is not necessary to wear in bed. Diet Resume your normal diet.  Return to Work Will be discussed at you follow up appointment. Call Your Doctor If Any of These Occur Redness, drainage, or swelling at the wound.  Temperature greater than 101 degrees. Severe pain not relieved by pain medication. Incision starts to come apart. Follow Up Appt Call today for appointment in 2 weeks ([phone]) or for problems.  If you have any hardware placed in your spine, you will need an x-ray before your appointment. 

## 2019-12-27 NOTE — Care Management (Signed)
Per RN and discharge instructions, patient's HH PT was arranged with [facility] by Samson Frederic, CM yesterday.  Call placed to [facility] to ensure services.

## 2021-03-18 IMAGING — RF DG C-ARM 1-60 MIN
1 series · 2 of 2 positions shown · non-contrast
Comparison: Radiographs of the lumbar spine 05/09/2019.

CLINICAL DATA: Surgery, elective. Additional history provided:
L4-L5 TLIF and L5-S1 foraminotomy. Provided fluoroscopy time: 1
minutes, 15 seconds (122.07 mGy).

EXAM:
LUMBAR SPINE - 2-3 VIEW; DG C-ARM 1-60 MIN

[Series 1: run · 2 of 2 slices shown]
[im 1/2]
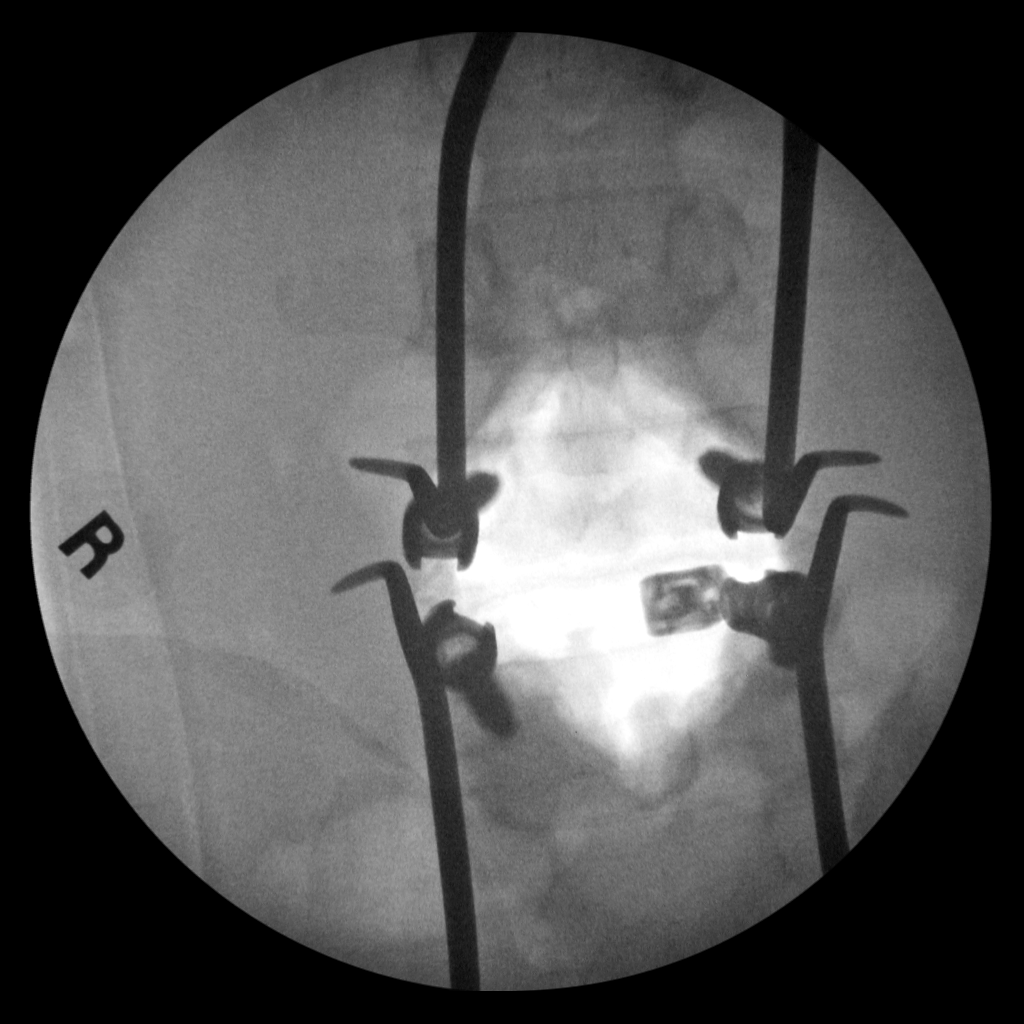
[im 2/2]
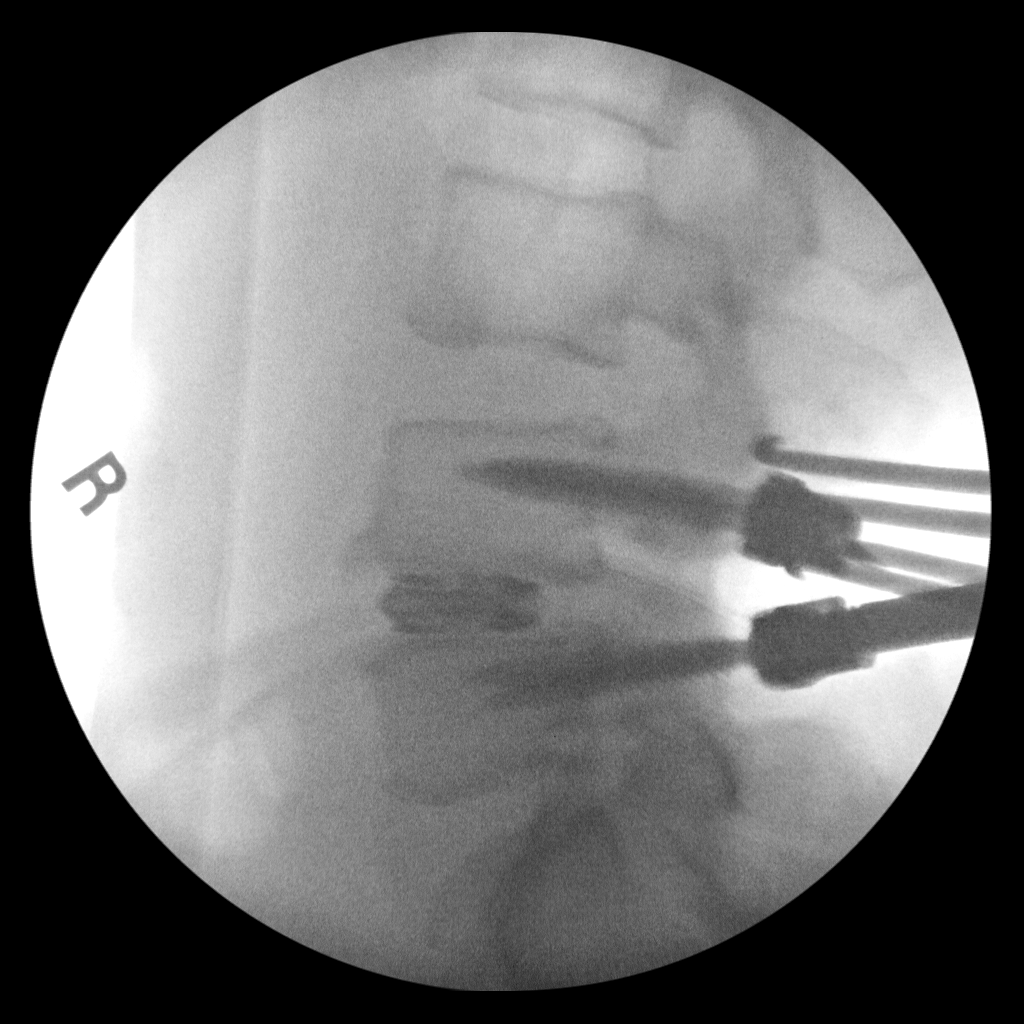

[2 of 2 positions shown; findings below may reference images not displayed]

FINDINGS: PA and lateral view intraoperative fluoroscopic images of the lumbar
spine are submitted, 2 images total. The images demonstrate
bilateral pedicle screws at L4 and L5, as well as an L4-L5 interbody
spacer. Overlying retractors.
IMPRESSION: Two intraoperative fluoroscopic images of the lumbar spine as
described.

## 2022-02-13 ENCOUNTER — Emergency Department (HOSPITAL_BASED_OUTPATIENT_CLINIC_OR_DEPARTMENT_OTHER): Payer: No Typology Code available for payment source | Admitting: Radiology

## 2022-02-13 ENCOUNTER — Encounter (HOSPITAL_BASED_OUTPATIENT_CLINIC_OR_DEPARTMENT_OTHER): Payer: Self-pay | Admitting: Emergency Medicine

## 2022-02-13 ENCOUNTER — Other Ambulatory Visit: Payer: Self-pay

## 2022-02-13 ENCOUNTER — Emergency Department (HOSPITAL_BASED_OUTPATIENT_CLINIC_OR_DEPARTMENT_OTHER)
Admission: EM | Admit: 2022-02-13 | Discharge: 2022-02-13 | Disposition: A | Discharge status: Home / Self Care | Payer: No Typology Code available for payment source | Attending: Emergency Medicine | Admitting: Emergency Medicine

## 2022-02-13 DIAGNOSIS — G8929 Other chronic pain: Secondary | ICD-10-CM | POA: Insufficient documentation

## 2022-02-13 DIAGNOSIS — M545 Low back pain, unspecified: Secondary | ICD-10-CM | POA: Insufficient documentation

## 2022-02-13 DIAGNOSIS — R109 Unspecified abdominal pain: Secondary | ICD-10-CM | POA: Diagnosis not present

## 2022-02-13 MED ORDER — IBUPROFEN 800 MG PO TABS
800.0000 mg | ORAL_TABLET | Freq: Once | ORAL | Status: DC
  Filled 2022-02-13: qty 1

## 2022-02-13 MED ORDER — KETOROLAC TROMETHAMINE 15 MG/ML IJ SOLN
15.0000 mg | Freq: Once | INTRAMUSCULAR | Status: AC
  Administered 2022-02-13: 15 mg via INTRAMUSCULAR
  Filled 2022-02-13: qty 1

## 2022-02-13 MED ORDER — PREDNISONE 20 MG PO TABS: 40.0000 mg | ORAL_TABLET | Freq: Every day | ORAL | 0 refills | Status: AC

## 2022-02-13 MED ORDER — PREDNISONE 50 MG PO TABS
60.0000 mg | ORAL_TABLET | Freq: Once | ORAL | Status: AC
  Administered 2022-02-13: 60 mg via ORAL
  Filled 2022-02-13: qty 1

## 2022-02-13 NOTE — ED Notes (Signed)
Patient verbalizes understanding of discharge instructions. Opportunity for questioning and answers were provided. Armband removed by staff, pt discharged from ED. Ambulated out to lobby  

## 2022-02-13 NOTE — ED Triage Notes (Signed)
Pt here from home with c/o  lower left back pain , pain started after work yesterday

## 2022-02-13 NOTE — ED Provider Notes (Signed)
[facility] EMERGENCY DEPT Provider Note   CSN: [id number] Arrival date & time: 02/13/22  1605     History  Chief Complaint  Patient presents with   Back Pain    Brent Crawford is a 51 y.o. male.   Back Pain Patient presents with acute on chronic back pain.  Has a history of chronic back pain has had previous back surgery.  States yesterday developed worsening pain in the back.  No injury but states when did bend over to pick something up the pain got even worse.  It is more in the left flank and does go a little higher than usual.  No radiation of the leg.  No numbness weakness.  No loss of bladder or bowel control.  No fever.  Is on chronic pain meds for it.    Past Medical History:  Diagnosis Date   Anxiety    Depression    Pre-diabetes    Sleep apnea    does not use CPAP    Home Medications Prior to Admission medications   Medication Sig Start Date End Date Taking? Authorizing Provider  predniSONE (DELTASONE) 20 MG tablet Take 2 tablets (40 mg total) by mouth daily. 02/13/22  Yes Benjiman Core, MD  oxyCODONE (OXY IR/ROXICODONE) 5 MG immediate release tablet Take 5 mg by mouth every 4 (four) hours as needed for severe pain.    [provider]      Allergies    Patient has no known allergies.    Review of Systems   Review of Systems  Musculoskeletal:  Positive for back pain.    Physical Exam Updated Vital Signs BP (!) 131/105   Pulse 67   Temp 98.3 F (36.8 C) (Temporal)   Resp 18   SpO2 100%  Physical Exam Vitals and nursing note reviewed.  Pulmonary:     Effort: Pulmonary effort is normal.  Abdominal:     Tenderness: There is no abdominal tenderness.  Musculoskeletal:        General: No tenderness.     Comments: Good straight leg raise bilaterally.  Skin:    Capillary Refill: Capillary refill takes less than 2 seconds.  Neurological:     Mental Status: He is alert and oriented to person, place, and time.     ED  Results / Procedures / Treatments   Labs (all labs ordered are listed, but only abnormal results are displayed) Labs Reviewed - No data to display  EKG None  Radiology DG Lumbar Spine Complete  Result Date: 02/13/2022 CLINICAL DATA:  Back pain EXAM: LUMBAR SPINE - COMPLETE 4+ VIEW COMPARISON:  08/06/2020 FINDINGS: No recent fracture is seen. There is surgical fusion at L4-L5 level. Intervertebral disc spacer is noted. Minimal decrease in height of vertebral bodies in lower thoracic spine may be residual from previous injury. Bony spurs are seen in lower thoracic spine. Alignment of posterior margins of vertebral bodies is unremarkable. No significant interval changes are noted. Paraspinal soft tissues are unremarkable. IMPRESSION: No recent fracture is seen in lumbar spine. There is previous surgical fusion at the L4-L5 level. Electronically Signed   By: Ernie Avena M.D.   On: 02/13/2022 17:54    Procedures Procedures    Medications Ordered in ED Medications  ibuprofen (ADVIL) tablet 800 mg (has no administration in time range)  ketorolac (TORADOL) 15 MG/ML injection 15 mg (15 mg Intramuscular Given 02/13/22 2126)  predniSONE (DELTASONE) tablet 60 mg (60 mg Oral Given 02/13/22 2127)    ED  Course/ Medical Decision Making/ A&P                           Medical Decision Making Amount and/or Complexity of Data Reviewed Radiology: ordered.  Risk Prescription drug management.  Patient with acute on chronic back pain.  Low back and left flank.  No urinary symptoms.  Similar pain in the past.  Unsure what occurred this time though.  I reviewed previous note from the [place] pain management.  Continues to be on pain medicines.  Unable to look up drug database however due to technical issues.  I think more likely this is acute on chronic back pain.  Doubt other causes such as aortic dissection aortic aneurysm or kidney stone.  Feeling somewhat better after having some Motrin.  Will give  shot of Toradol here and a dose of steroids.  Steroids have helped in the past.  Follow-up as an outpatient needed.  Already has muscle relaxers and lidocaine patches at home to use as needed.        Final Clinical Impression(s) / ED Diagnoses Final diagnoses:  Acute left-sided low back pain without sciatica    Rx / DC Orders ED Discharge Orders          Ordered    predniSONE (DELTASONE) 20 MG tablet  Daily        02/13/22 2146              Benjiman Core, MD 02/13/22 2154
